# Patient Record
Sex: Female | Born: 1965 | Race: White | Hispanic: No | Marital: Married | Smoking: Former smoker
Health system: Southern US, Community
[De-identification: ages and names within clinical notes are randomized; demographics above are authoritative.]

## PROBLEM LIST (undated history)

## (undated) DIAGNOSIS — M1990 Unspecified osteoarthritis, unspecified site: Secondary | ICD-10-CM

## (undated) DIAGNOSIS — K635 Polyp of colon: Secondary | ICD-10-CM

## (undated) DIAGNOSIS — E782 Mixed hyperlipidemia: Secondary | ICD-10-CM

## (undated) DIAGNOSIS — E785 Hyperlipidemia, unspecified: Secondary | ICD-10-CM

## (undated) DIAGNOSIS — I10 Essential (primary) hypertension: Secondary | ICD-10-CM

## (undated) DIAGNOSIS — R7303 Prediabetes: Secondary | ICD-10-CM

## (undated) DIAGNOSIS — T8859XA Other complications of anesthesia, initial encounter: Secondary | ICD-10-CM

## (undated) DIAGNOSIS — E559 Vitamin D deficiency, unspecified: Secondary | ICD-10-CM

## (undated) DIAGNOSIS — K8010 Calculus of gallbladder with chronic cholecystitis without obstruction: Secondary | ICD-10-CM

## (undated) DIAGNOSIS — J302 Other seasonal allergic rhinitis: Secondary | ICD-10-CM

## (undated) DIAGNOSIS — F419 Anxiety disorder, unspecified: Secondary | ICD-10-CM

## (undated) DIAGNOSIS — T4145XA Adverse effect of unspecified anesthetic, initial encounter: Secondary | ICD-10-CM

## (undated) DIAGNOSIS — E213 Hyperparathyroidism, unspecified: Secondary | ICD-10-CM

## (undated) DIAGNOSIS — G589 Mononeuropathy, unspecified: Secondary | ICD-10-CM

## (undated) HISTORY — DX: Calculus of gallbladder with chronic cholecystitis without obstruction: K80.10

## (undated) HISTORY — PX: BREAST BIOPSY: SHX20

## (undated) HISTORY — PX: DILATION AND CURETTAGE OF UTERUS: SHX78

## (undated) HISTORY — PX: TUMOR EXCISION: SHX421

## (undated) HISTORY — DX: Essential (primary) hypertension: I10

---
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
## 1997-08-26 HISTORY — PX: ABDOMINAL HYSTERECTOMY: SHX81

## 2005-04-23 ENCOUNTER — Ambulatory Visit: Payer: Self-pay | Admitting: Surgery

## 2007-10-15 ENCOUNTER — Ambulatory Visit: Payer: Self-pay | Admitting: Surgery

## 2009-04-10 ENCOUNTER — Ambulatory Visit: Payer: Self-pay | Admitting: Unknown Physician Specialty

## 2009-04-10 HISTORY — PX: COLONOSCOPY: SHX174

## 2010-08-24 ENCOUNTER — Emergency Department: Payer: Self-pay | Admitting: Emergency Medicine

## 2010-08-26 HISTORY — PX: CHOLECYSTECTOMY: SHX55

## 2010-08-27 ENCOUNTER — Emergency Department: Payer: Self-pay | Admitting: Emergency Medicine

## 2011-03-25 ENCOUNTER — Emergency Department: Payer: Self-pay | Admitting: Unknown Physician Specialty

## 2011-05-01 ENCOUNTER — Ambulatory Visit: Payer: Self-pay | Admitting: General Surgery

## 2011-05-01 DIAGNOSIS — T884XXA Failed or difficult intubation, initial encounter: Secondary | ICD-10-CM

## 2011-05-01 HISTORY — DX: Failed or difficult intubation, initial encounter: T88.4XXA

## 2011-05-01 HISTORY — PX: CHOLECYSTECTOMY: SHX55

## 2011-06-10 ENCOUNTER — Ambulatory Visit: Payer: Self-pay | Admitting: General Surgery

## 2011-06-10 HISTORY — PX: BREAST BIOPSY: SHX20

## 2011-08-27 HISTORY — PX: COLONOSCOPY: SHX174

## 2012-06-10 HISTORY — PX: COLONOSCOPY: SHX174

## 2013-07-19 ENCOUNTER — Encounter: Payer: Self-pay | Admitting: General Surgery

## 2013-07-19 ENCOUNTER — Ambulatory Visit (INDEPENDENT_AMBULATORY_CARE_PROVIDER_SITE_OTHER): Payer: BC Managed Care – PPO | Admitting: General Surgery

## 2013-07-19 VITALS — BP 132/70 | HR 76 | Resp 16 | Ht 65.0 in | Wt 261.0 lb

## 2013-07-19 DIAGNOSIS — R928 Other abnormal and inconclusive findings on diagnostic imaging of breast: Secondary | ICD-10-CM

## 2013-07-19 NOTE — Progress Notes (Signed)
Patient ID: Laurie Hubbard, female   DOB: 1965/11/18, 47 y.o.   MRN: [id number]  Chief Complaint  Patient presents with  . Follow-up    mammogram    HPI Laurie Hubbard is a 47 y.o. female who presents for a breast evaluation. The most recent mammogram was done on 06/16/13 at [facility]. Patient does perform regular self breast checks and gets regular mammograms done.   Patient had a stereo biopsy of a density in central right breast in January 2013 with benign findings. She subsequently had a stable mammogram in October 2013 and was discharged at that time. There is question on a recent mammogram at the same location and so she was referred back here.   HPI  Past Medical History  Diagnosis Date  . Hypertension   . Calculus of gallbladder with other cholecystitis, without mention of obstruction     Past Surgical History  Procedure Laterality Date  . Colonoscopy  2013    Dr Mechele Collin  . Tonsillectomy  1971  . Abdominal hysterectomy  1999  . Cholecystectomy  2012  . Breast biopsy Right     History reviewed. No pertinent family history.  Social History History  Substance Use Topics  . Smoking status: Former Smoker -- 1.00 packs/day for 24 years    Types: Cigarettes  . Smokeless tobacco: Never Used  . Alcohol Use: No    Allergies  Allergen Reactions  . Keflet [Cephalexin] Itching    Current Outpatient Prescriptions  Medication Sig Dispense Refill  . cetirizine (ZYRTEC) 10 MG tablet Take 10 mg by mouth daily.      Marland Kitchen estradiol (ESTRACE) 0.5 MG tablet Take 1 tablet by mouth daily.      Marland Kitchen lisinopril-hydrochlorothiazide (PRINZIDE,ZESTORETIC) 20-25 MG per tablet Take 1 tablet by mouth daily.      Marland Kitchen PARoxetine (PAXIL) 10 MG tablet Take 1 tablet by mouth daily.      . simvastatin (ZOCOR) 10 MG tablet Take 1 tablet by mouth daily.       No current facility-administered medications for this visit.    Review of Systems Review of Systems  Constitutional: Negative.   Respiratory:  Negative.   Cardiovascular: Negative.     Blood pressure 132/70, pulse 76, resp. rate 16, height 5\' 5"  (1.651 m), weight 261 lb (118.389 kg).  Physical Exam Physical Exam  Constitutional: She is oriented to person, place, and time. She appears well-developed and well-nourished.  Eyes: Conjunctivae are normal. No scleral icterus.  Neck: Neck supple. No thyromegaly present.  Cardiovascular: Normal rate, regular rhythm and normal heart sounds.   Pulmonary/Chest: Breath sounds normal. Right breast exhibits no inverted nipple, no mass, no nipple discharge, no skin change and no tenderness. Left breast exhibits no inverted nipple, no mass, no nipple discharge, no skin change and no tenderness. Breasts are symmetrical.  Lymphadenopathy:    She has no cervical adenopathy.    She has no axillary adenopathy.  Neurological: She is alert and oriented to person, place, and time.  Skin: Skin is warm and dry.    Data Reviewed Mammogram reviewed. There is no apparent change on my evaluation in the right breast compared to last year's study.   Assessment    Stable exam will discuss current mammogram with the reading radiologist and decide if there is anything of concern.    Plan    Patient will be notified after this discussion.        Thien Berka G 07/21/2013, 6:10 AM

## 2013-07-19 NOTE — Patient Instructions (Addendum)
Follow is to be announced.

## 2013-07-21 ENCOUNTER — Encounter: Payer: Self-pay | Admitting: General Surgery

## 2013-07-29 ENCOUNTER — Telehealth: Payer: Self-pay | Admitting: *Deleted

## 2013-07-29 NOTE — Telephone Encounter (Signed)
Message left on home and cell numbers for patient to call the office.  Patient has been scheduled for right breast additional views at [facility] for 08-04-13 at 2:30 pm (Please note: this is the latest time offered for this type of mammogram per [facility]).

## 2013-07-29 NOTE — Telephone Encounter (Signed)
Spoke with patient and she is not sure if she can make that time or not. She will call [facility] when she gets home today to possibly reschedule this appointment.

## 2013-08-23 ENCOUNTER — Encounter: Payer: Self-pay | Admitting: General Surgery

## 2013-08-24 ENCOUNTER — Telehealth: Payer: Self-pay | Admitting: *Deleted

## 2013-08-24 NOTE — Telephone Encounter (Signed)
Notified patient as instructed, patient pleased. Discussed follow-up appointments, patient agrees. Tussinex 5 ml Q HS prn cough 50 ml called to pharmacy per Dr Evette Cristal.

## 2013-08-24 NOTE — Telephone Encounter (Signed)
Message copied by Currie Paris on Tue Aug 24, 2013  8:29 AM ------      Message from: Kieth Brightly      Created: Mon Aug 23, 2013  1:57 PM       Additional views of breast-no mass. Can return to routine yrly f/u with mammogram in Oct 2015. Please inform pt. ------

## 2013-08-24 NOTE — Telephone Encounter (Signed)
Written prescription done.

## 2014-06-27 ENCOUNTER — Encounter: Payer: Self-pay | Admitting: General Surgery

## 2015-01-30 ENCOUNTER — Encounter: Payer: Self-pay | Admitting: *Deleted

## 2015-02-09 ENCOUNTER — Ambulatory Visit (INDEPENDENT_AMBULATORY_CARE_PROVIDER_SITE_OTHER): Payer: BC Managed Care – PPO | Admitting: Obstetrics and Gynecology

## 2015-02-09 ENCOUNTER — Encounter: Payer: Self-pay | Admitting: Obstetrics and Gynecology

## 2015-02-09 VITALS — BP 151/95 | HR 102 | Ht 65.0 in | Wt 267.1 lb

## 2015-02-09 DIAGNOSIS — N9089 Other specified noninflammatory disorders of vulva and perineum: Secondary | ICD-10-CM | POA: Diagnosis not present

## 2015-02-10 ENCOUNTER — Ambulatory Visit
Admission: RE | Admit: 2015-02-10 | Discharge: 2015-02-10 | Disposition: A | Discharge status: Home / Self Care | Payer: BC Managed Care – PPO | Source: Ambulatory Visit | Attending: Unknown Physician Specialty | Admitting: Unknown Physician Specialty

## 2015-02-10 ENCOUNTER — Encounter
Admission: RE | Disposition: A | Discharge status: Home / Self Care | Payer: Self-pay | Source: Ambulatory Visit | Attending: Unknown Physician Specialty

## 2015-02-10 ENCOUNTER — Ambulatory Visit: Payer: BC Managed Care – PPO | Admitting: Anesthesiology

## 2015-02-10 DIAGNOSIS — M23222 Derangement of posterior horn of medial meniscus due to old tear or injury, left knee: Secondary | ICD-10-CM | POA: Insufficient documentation

## 2015-02-10 DIAGNOSIS — K219 Gastro-esophageal reflux disease without esophagitis: Secondary | ICD-10-CM | POA: Diagnosis not present

## 2015-02-10 DIAGNOSIS — Z9102 Food additives allergy status: Secondary | ICD-10-CM | POA: Insufficient documentation

## 2015-02-10 DIAGNOSIS — Z90710 Acquired absence of both cervix and uterus: Secondary | ICD-10-CM | POA: Insufficient documentation

## 2015-02-10 DIAGNOSIS — Z6841 Body Mass Index (BMI) 40.0 and over, adult: Secondary | ICD-10-CM | POA: Insufficient documentation

## 2015-02-10 DIAGNOSIS — Z803 Family history of malignant neoplasm of breast: Secondary | ICD-10-CM | POA: Diagnosis not present

## 2015-02-10 DIAGNOSIS — Z833 Family history of diabetes mellitus: Secondary | ICD-10-CM | POA: Insufficient documentation

## 2015-02-10 DIAGNOSIS — Z9049 Acquired absence of other specified parts of digestive tract: Secondary | ICD-10-CM | POA: Insufficient documentation

## 2015-02-10 DIAGNOSIS — E785 Hyperlipidemia, unspecified: Secondary | ICD-10-CM | POA: Diagnosis not present

## 2015-02-10 DIAGNOSIS — I10 Essential (primary) hypertension: Secondary | ICD-10-CM | POA: Diagnosis not present

## 2015-02-10 DIAGNOSIS — Z881 Allergy status to other antibiotic agents status: Secondary | ICD-10-CM | POA: Insufficient documentation

## 2015-02-10 DIAGNOSIS — Z8371 Family history of colonic polyps: Secondary | ICD-10-CM | POA: Diagnosis not present

## 2015-02-10 DIAGNOSIS — E669 Obesity, unspecified: Secondary | ICD-10-CM | POA: Insufficient documentation

## 2015-02-10 DIAGNOSIS — Z79899 Other long term (current) drug therapy: Secondary | ICD-10-CM | POA: Diagnosis not present

## 2015-02-10 DIAGNOSIS — M25562 Pain in left knee: Secondary | ICD-10-CM | POA: Diagnosis present

## 2015-02-10 DIAGNOSIS — Z800 Family history of malignant neoplasm of digestive organs: Secondary | ICD-10-CM | POA: Diagnosis not present

## 2015-02-10 HISTORY — PX: KNEE ARTHROSCOPY: SHX127

## 2015-02-10 HISTORY — DX: Other seasonal allergic rhinitis: J30.2

## 2015-02-10 HISTORY — DX: Other complications of anesthesia, initial encounter: T88.59XA

## 2015-02-10 HISTORY — DX: Mononeuropathy, unspecified: G58.9

## 2015-02-10 HISTORY — DX: Adverse effect of unspecified anesthetic, initial encounter: T41.45XA

## 2015-02-10 HISTORY — DX: Hyperlipidemia, unspecified: E78.5

## 2015-02-10 HISTORY — DX: Unspecified osteoarthritis, unspecified site: M19.90

## 2015-02-10 SURGERY — ARTHROSCOPY, KNEE
Anesthesia: General | Laterality: Left | Wound class: Clean

## 2015-02-10 MED ORDER — PROPOFOL 10 MG/ML IV BOLUS
INTRAVENOUS | Status: DC | PRN
  Administered 2015-02-10: 130 mg via INTRAVENOUS
  Administered 2015-02-10: 50 mg via INTRAVENOUS

## 2015-02-10 MED ORDER — OXYCODONE HCL 5 MG/5ML PO SOLN: 5.0000 mg | Freq: Once | ORAL | Status: AC | PRN

## 2015-02-10 MED ORDER — LACTATED RINGERS IV SOLN
INTRAVENOUS | Status: DC
  Administered 2015-02-10: 07:00:00 via INTRAVENOUS

## 2015-02-10 MED ORDER — GLYCOPYRROLATE 0.2 MG/ML IJ SOLN
INTRAMUSCULAR | Status: DC | PRN
  Administered 2015-02-10: .1 mg via INTRAVENOUS

## 2015-02-10 MED ORDER — LACTATED RINGERS IR SOLN
Status: DC | PRN
  Administered 2015-02-10: 1000 mL

## 2015-02-10 MED ORDER — FENTANYL CITRATE (PF) 100 MCG/2ML IJ SOLN
INTRAMUSCULAR | Status: DC | PRN
  Administered 2015-02-10 (×3): 50 ug via INTRAVENOUS
  Administered 2015-02-10: 25 ug via INTRAVENOUS

## 2015-02-10 MED ORDER — SUCCINYLCHOLINE CHLORIDE 20 MG/ML IJ SOLN
INTRAMUSCULAR | Status: DC | PRN
  Administered 2015-02-10: 100 mg via INTRAVENOUS

## 2015-02-10 MED ORDER — DEXAMETHASONE SODIUM PHOSPHATE 4 MG/ML IJ SOLN
INTRAMUSCULAR | Status: DC | PRN
  Administered 2015-02-10: 4 mg via INTRAVENOUS

## 2015-02-10 MED ORDER — OXYCODONE HCL 5 MG PO TABS
5.0000 mg | ORAL_TABLET | Freq: Once | ORAL | Status: AC | PRN
  Administered 2015-02-10: 5 mg via ORAL

## 2015-02-10 MED ORDER — NORCO 5-325 MG PO TABS: 1.0000 | ORAL_TABLET | Freq: Four times a day (QID) | ORAL | Status: DC | PRN

## 2015-02-10 MED ORDER — ONDANSETRON HCL 4 MG/2ML IJ SOLN
INTRAMUSCULAR | Status: DC | PRN
  Administered 2015-02-10: 4 mg via INTRAVENOUS

## 2015-02-10 MED ORDER — LIDOCAINE HCL (CARDIAC) 20 MG/ML IV SOLN
INTRAVENOUS | Status: DC | PRN
  Administered 2015-02-10: 50 mg via INTRAVENOUS

## 2015-02-10 MED ORDER — BUPIVACAINE HCL (PF) 0.5 % IJ SOLN
INTRAMUSCULAR | Status: DC | PRN
  Administered 2015-02-10: 10 mL

## 2015-02-10 MED ORDER — PROMETHAZINE HCL 25 MG/ML IJ SOLN: 6.2500 mg | INTRAMUSCULAR | Status: DC | PRN

## 2015-02-10 MED ORDER — HYDROMORPHONE HCL 1 MG/ML IJ SOLN: 0.2500 mg | INTRAMUSCULAR | Status: DC | PRN

## 2015-02-10 MED ORDER — MIDAZOLAM HCL 5 MG/5ML IJ SOLN
INTRAMUSCULAR | Status: DC | PRN
  Administered 2015-02-10: 2 mg via INTRAVENOUS

## 2015-02-10 MED ORDER — KETOROLAC TROMETHAMINE 30 MG/ML IJ SOLN: 30.0000 mg | Freq: Once | INTRAMUSCULAR | Status: DC | PRN

## 2015-02-10 SURGICAL SUPPLY — 37 items
ARTHROWAND PARAGON T2 (SURGICAL WAND) ×2
BUR RADIUS 3.5 (BURR) IMPLANT
BUR RADIUS 4.0X18.5 (BURR) ×2 IMPLANT
BUR ROUND 5.5 (BURR) IMPLANT
BURR ROUND 12 FLUTE 4.0MM (BURR) IMPLANT
CUFF TOURN SGL QUICK 24 (TOURNIQUET CUFF)
CUFF TOURN SGL QUICK 30 (MISCELLANEOUS)
CUFF TOURN SGL QUICK 34 (TOURNIQUET CUFF) ×1
CUFF TRNQT CYL 24X4X40X1 (TOURNIQUET CUFF) IMPLANT
CUFF TRNQT CYL 34X4X40X1 (TOURNIQUET CUFF) ×1 IMPLANT
CUFF TRNQT CYL LO 30X4X (MISCELLANEOUS) IMPLANT
CUTTER SLOTTED WHISKER 4.0 (BURR) ×2 IMPLANT
DRAPE LEGGINS SURG 28X43 STRL (DRAPES) ×2 IMPLANT
DURAPREP 26ML APPLICATOR (WOUND CARE) ×2 IMPLANT
GAUZE PETROLATUM 1 X8 (GAUZE/BANDAGES/DRESSINGS) ×2 IMPLANT
GAUZE SPONGE 4X4 12PLY STRL (GAUZE/BANDAGES/DRESSINGS) ×2 IMPLANT
GLOVE BIO SURGEON STRL SZ7.5 (GLOVE) ×2 IMPLANT
GLOVE BIO SURGEON STRL SZ8 (GLOVE) ×2 IMPLANT
GLOVE INDICATOR 8.0 STRL GRN (GLOVE) ×2 IMPLANT
GOWN STRL REIN 2XL XLG LVL4 (GOWN DISPOSABLE) ×2 IMPLANT
GOWN STRL REUS W/TWL 2XL LVL3 (GOWN DISPOSABLE) ×2 IMPLANT
IV LACTATED RINGER IRRG 3000ML (IV SOLUTION) ×2
IV LR IRRIG 3000ML ARTHROMATIC (IV SOLUTION) ×2 IMPLANT
MANIFOLD 4PT FOR NEPTUNE1 (MISCELLANEOUS) ×2 IMPLANT
PACK ARTHROSCOPY KNEE (MISCELLANEOUS) ×2 IMPLANT
SET TUBE SUCT SHAVER OUTFL 24K (TUBING) ×2 IMPLANT
SUT ETHILON 3-0 FS-10 30 BLK (SUTURE) ×2
SUTURE EHLN 3-0 FS-10 30 BLK (SUTURE) ×1 IMPLANT
TAPE MICROFOAM 4IN (TAPE) ×2 IMPLANT
TUBING ARTHRO INFLOW-ONLY STRL (TUBING) ×2 IMPLANT
WAND ARTHRO PARAGON T2 (SURGICAL WAND) ×1 IMPLANT
WAND COVAC 50 IFS (MISCELLANEOUS) IMPLANT
WAND HAND CNTRL MULTIVAC 50 (MISCELLANEOUS) ×2 IMPLANT
WAND HAND CNTRL MULTIVAC 90 (MISCELLANEOUS) IMPLANT
WAND MEGAVAC 90 (MISCELLANEOUS) IMPLANT
WAND ULTRAVAC 90 (MISCELLANEOUS) IMPLANT
WRAP KNEE W/COLD PACKS 25.5X14 (SOFTGOODS) ×2 IMPLANT

## 2015-02-10 NOTE — H&P (Signed)
  H and P reviewed. No changes. Uploaded at later date. 

## 2015-02-10 NOTE — Transfer of Care (Signed)
Immediate Anesthesia Transfer of Care Note  Patient: Laurie Hubbard  Procedure(s) Performed: Procedure(s): ARTHROSCOPY KNEE (Left)  Patient Location: PACU  Anesthesia Type: General  Level of Consciousness: awake, alert  and patient cooperative  Airway and Oxygen Therapy: Patient Spontanous Breathing and Patient connected to supplemental oxygen  Post-op Assessment: Post-op Vital signs reviewed, Patient's Cardiovascular Status Stable, Respiratory Function Stable, Patent Airway and No signs of Nausea or vomiting  Post-op Vital Signs: Reviewed and stable  Complications: No apparent anesthesia complications

## 2015-02-10 NOTE — Anesthesia Postprocedure Evaluation (Signed)
  Anesthesia Post-op Note  Patient: Laurie Hubbard  Procedure(s) Performed: Procedure(s): ARTHROSCOPY KNEE (Left)  Anesthesia type:General  Patient location: PACU  Post pain: Pain level controlled  Post assessment: Post-op Vital signs reviewed, Patient's Cardiovascular Status Stable, Respiratory Function Stable, Patent Airway and No signs of Nausea or vomiting  Post vital signs: Reviewed and stable  Last Vitals:  Filed Vitals:   02/10/15 0935  BP:   Pulse: 108  Temp:   Resp: 20    Level of consciousness: awake, alert  and patient cooperative  Complications: No apparent anesthesia complications

## 2015-02-10 NOTE — Op Note (Signed)
Preoperative diagnosis: Torn medial meniscus left knee with possible chondral changes  Postop diagnosis: Torn medial meniscus left knee with medial compartment and retropatellar chondral changes  Operation: Arthroscopic partial medial meniscectomy left knee with chondral debridement  Surgeon: Vilinda Flake, MD  Anesthesia: Gen.   History: Patient's had a long history of left knee pain.  His plain films revealed mild narrowing of her medial compartment on the left .  She had an MRI which revealed a torn medial meniscus and medial compartment chondral changes.The patient was scheduled for surgery due to persistent discomfort despite conservative treatment.  The patient was taken the operating room where satisfactory general anesthesia was achieved. A tourniquet and leg holder were was applied to the left thigh. The left lower extremity was supported with a well leg holder. The left knee was prepped and draped in usual fashion for an arthroscopic procedure. An inflow cannula was introduced superomedially. The joint was distended with lactated Ringer's. Scope was introduced through an inferolateral puncture wound and a probe through an inferomedial puncture wound. Inspection of the medial compartment revealed  a posterior horn medial meniscus tear along with a grade 3 chondral lesion in the mid weightbearing portion of the medial femoral condyle. I went ahead and performed a partial medial meniscectomy using a combination of basket biters and a motorized resector. The remaining rim was contoured with an angled ArthroCare wand. I then debrided and coblated the medial femoral chondral lesion with an ArthroCare Paragon wand. Inspection of the intercondylar notch revealed normal cruciates. Inspection of the the lateral compartment revealed no chondral or meniscal pathology.   The scope was introduced through the intercondylar notch into into the posterior recess of the medial and lateral compartments. No  additional pathology was noted in the posterior recesses. Trochlear groove was inspected and appeared to be fairly smooth.  Patella surface was moderately fibrillated. I debrided the retropatellar surface with a turbo whisker shaver. I observed patella tracking from the superomedial portal. The the patella seemed to track fairly well.  The instruments were removed from the joint at this time. The puncture wounds were closed with 3-0 nylon in vertical mattress fashion. I injected each puncture wound with several cc of half percent Marcaine without epinephrine. Betadine was applied the wounds followed by sterile dressing. An ice pack was applied to the right knee. The patient was awakened and transferred to her stretcher bed. She was taken to the recovery room in satisfactory condition.  The tourniquet was was not inflated during the course of the procedure. Blood loss was negligible.

## 2015-02-10 NOTE — Discharge Instructions (Signed)
General Anesthesia, Care After Refer to this sheet in the next few weeks. These instructions provide you with information on caring for yourself after your procedure. Your health care provider may also give you more specific instructions. Your treatment has been planned according to current medical practices, but problems sometimes occur. Call your health care provider if you have any problems or questions after your procedure. WHAT TO EXPECT AFTER THE PROCEDURE After the procedure, it is typical to experience:  Sleepiness.  Nausea and vomiting. HOME CARE INSTRUCTIONS  For the first 24 hours after general anesthesia:  Have a responsible person with you.  Do not drive a car. If you are alone, do not take public transportation.  Do not drink alcohol.  Do not take medicine that has not been prescribed by your health care provider.  Do not sign important papers or make important decisions.  You may resume a normal diet and activities as directed by your health care provider.  Change bandages (dressings) as directed.  If you have questions or problems that seem related to general anesthesia, call the hospital and ask for the anesthetist or anesthesiologist on call. SEEK MEDICAL CARE IF:  You have nausea and vomiting that continue the day after anesthesia.  You develop a rash. SEEK IMMEDIATE MEDICAL CARE IF:   You have difficulty breathing.  You have chest pain.  You have any allergic problems. Document Released: 11/18/2000 Document Revised: 08/17/2013 Document Reviewed: 02/25/2013 [facility] Patient Information 2015 [place], [place]. This information is not intended to replace advice given to you by your health care provider. Make sure you discuss any questions you have with your health care provider. [facility]  Kathrene Alu., M.D. [phone]   KNEE ARTHROSCOPY POST OPERATION INSTRUCTIONS:  PLEASE READ THESE INSTRUCTIONS ABOUT  POST OPERATION CARE. THEY WILL ANSWER MOST OF YOUR QUESTIONS.  You have been given a prescription for pain. Please take as directed for pain.  You can walk, keeping the knee slightly stiff-avoid doing too much bending the first day. (if ACL reconstruction is performed, keep brace locked in extension when walking.)  You will use crutches or cane if needed. Can weight bear as tolerated  Plan to take three to four days off from work. You can resume work when you are comfortable. (This can be a week or more, depending on the type of work you do.)  To reduce pain and swelling, place one to two pillows under the knee the first two or three days when sitting or lying. An ice pack may be placed on top of the area over the dressing. Instructions for making homemade icepack are as follow:  Flexible homemade alcohol water ice pack  2 cups water  1 cup rubbing alcohol  food coloring for the blue tint (optional)  2 zip-top bags - gallon-size  Mix the water and alcohol together in one of your zip-top bags and add food coloring. Release as much air as possible and seal the bag. Place in freezer for at least 12 hours.  The small incisions in your knee are closed with nylon stitches. They will be removed in the office.  The bulky dressing may be removed in the third day after surgery. (If ACL surgery-DO NOT REMOVE BANDAGES). Put a waterproof band-aid over each stitch. Do not put any creams or ointments on wounds. You may shower at this time, but change waterproof band-aids after showering. KEEP INCISIONS CLEAN AND DRY UNTIL YOU RETURN TO THE OFFICE.  Sometimes the operative area remains somewhat painful and swollen for several weeks. This is usually nothing to worry about, but call if you have any excessive symptoms, especially fever. It is not unusual to have a low grade fever of 99 degrees for the first few days. If persist after 3-4 days call the office. It is not uncommon for the pain to be a little worse on the  third day after surgery.  Begin doing gentle exercises right away. They will be limited by the amount of pain and swelling you have.  Exercising will reduce the swelling, increase motion, and prevent muscle weakness. Exercises: Straight leg raising and gentle knee bending.  Take 81 milligram aspirin twice a day for 2 weeks after meals or milk. This along with elevation will help reduce the possibility of phlebitis in your operated leg.  Avoid strenuous athletics for a minimum of 4 to 6 weeks after arthroscopic surgery (approximately five months if ACL surgery).  If the surgery included ACL reconstruction the brace that is supplied to the extremity post surgery is to be locked in extension when you are asleep and is to be locked in extension when you are ambulating. It can be unlocked for exercises or sitting.  Keep your post surgery appointment that has been made for you. If you do not remember the date call [phone]. Your follow up appointment should be between 7-10 days.

## 2015-02-10 NOTE — Anesthesia Preprocedure Evaluation (Addendum)
Anesthesia Evaluation  Patient identified by MRN, date of birth, ID band Patient awake    Reviewed: Allergy & Precautions, NPO status , Patient's Chart, lab work & pertinent test results  History of Anesthesia Complications (+) DIFFICULT AIRWAY and history of anesthetic complications  Airway Mallampati: III  TM Distance: >3 FB Neck ROM: Full    Dental no notable dental hx.    Pulmonary neg pulmonary ROS, former smoker,  breath sounds clear to auscultation  Pulmonary exam normal       Cardiovascular hypertension, Normal cardiovascular examRhythm:Regular Rate:Normal     Neuro/Psych negative neurological ROS  negative psych ROS   GI/Hepatic negative GI ROS, Neg liver ROS,   Endo/Other  negative endocrine ROS  Renal/GU negative Renal ROS  negative genitourinary   Musculoskeletal negative musculoskeletal ROS (+) Arthritis -,   Abdominal   Peds negative pediatric ROS (+)  Hematology negative hematology ROS (+)   Anesthesia Other Findings   Reproductive/Obstetrics negative OB ROS                            Anesthesia Physical Anesthesia Plan  ASA: III  Anesthesia Plan: General   Post-op Pain Management:    Induction: Intravenous  Airway Management Planned: LMA and Oral ETT  Additional Equipment:   Intra-op Plan:   Post-operative Plan: Extubation in OR  Informed Consent: I have reviewed the patients History and Physical, chart, labs and discussed the procedure including the risks, benefits and alternatives for the proposed anesthesia with the patient or authorized representative who has indicated his/her understanding and acceptance.   Dental advisory given  Plan Discussed with: CRNA  Anesthesia Plan Comments: (glidescope success last surgery.  Per anesthesia record, easy to ventilate, but required 5 attempts to intubate. Will use glidescop)       Anesthesia Quick  Evaluation

## 2015-02-10 NOTE — Anesthesia Procedure Notes (Signed)
Procedure Name: Intubation Date/Time: 02/10/2015 7:46 AM Performed by: Mayme Genta Pre-anesthesia Checklist: Patient identified, Emergency Drugs available, Suction available, Patient being monitored and Timeout performed Patient Re-evaluated:Patient Re-evaluated prior to inductionOxygen Delivery Method: Circle system utilized Preoxygenation: Pre-oxygenation with 100% oxygen Intubation Type: IV induction Ventilation: Mask ventilation without difficulty Laryngoscope Size: Glidescope Grade View: Grade I Tube type: Oral Tube size: 7.0 mm Number of attempts: 1 Airway Equipment and Method: Stylet Placement Confirmation: ETT inserted through vocal cords under direct vision,  positive ETCO2 and breath sounds checked- equal and bilateral Secured at: 22 cm Tube secured with: Tape Dental Injury: Teeth and Oropharynx as per pre-operative assessment  Difficulty Due To: Difficulty was anticipated Comments: Pt shoulders elevated on blankets. Easy mask ventilation. Tolerated well by pt. Grade I view with glidescope.

## 2015-02-13 ENCOUNTER — Encounter: Payer: Self-pay | Admitting: Unknown Physician Specialty

## 2015-02-23 ENCOUNTER — Telehealth: Payer: Self-pay | Admitting: Obstetrics and Gynecology

## 2015-02-23 NOTE — Telephone Encounter (Signed)
PT IS CALLING TO SEE IF YOU KNOW ANYTHING ABOUT THE BRACA TEST AND HER INSURANCE. PT WOULD LIKE A CALL BACK

## 2015-02-24 ENCOUNTER — Telehealth: Payer: Self-pay | Admitting: Obstetrics and Gynecology

## 2015-02-24 NOTE — Telephone Encounter (Signed)
Pt came in, i gave her the myraid promise, asked her to call and if any problems to contact me and i will call Leda Gauze

## 2015-02-24 NOTE — Telephone Encounter (Signed)
See other note

## 2015-02-24 NOTE — Telephone Encounter (Signed)
Pt called yesterday and I left a message she was wondering about her braca, she got home and had a letter from [employer] that they denied the test  Because prior auth was not done, but she told us weeks ago that [employer] would pay for it if the prior auth was done, and she thought that the blood work was being held on to until the prior Josem Kaufmann was done so she is wondering what is going on, do we need to get [facility] involved in this, and do you know if prior auth. has been done. Pt is coming by to frop off the letter from [facility].

## 2015-03-09 ENCOUNTER — Encounter: Payer: Self-pay | Admitting: Obstetrics and Gynecology

## 2015-04-03 ENCOUNTER — Telehealth: Payer: Self-pay | Admitting: Obstetrics and Gynecology

## 2015-04-03 NOTE — Telephone Encounter (Signed)
Pt called and she has been put on an antibiotic due to having teeth pulled and she stated she has a yeast infection and wanted to know if you could sned a diflucan in to her pharmacy [facility] [address] she is leaving tomorrow 04/04/15  At 4:00 to go [place] the beach, pt is aware that you don't work on Mondays and thatt you have a full clinic on Tuesday.

## 2015-04-04 ENCOUNTER — Other Ambulatory Visit: Payer: Self-pay | Admitting: Obstetrics and Gynecology

## 2015-04-04 MED ORDER — FLUCONAZOLE 150 MG PO TABS: 150.0000 mg | ORAL_TABLET | Freq: Once | ORAL | Status: DC

## 2015-04-04 NOTE — Telephone Encounter (Signed)
Called pt notified medication sent to pharmacy

## 2015-04-04 NOTE — Telephone Encounter (Signed)
Please let her know I just sent in, so to stop by on way out of town.

## 2015-05-03 ENCOUNTER — Encounter: Payer: Self-pay | Admitting: Obstetrics and Gynecology

## 2015-05-04 ENCOUNTER — Encounter: Payer: Self-pay | Admitting: Obstetrics and Gynecology

## 2015-05-04 ENCOUNTER — Ambulatory Visit (INDEPENDENT_AMBULATORY_CARE_PROVIDER_SITE_OTHER): Payer: BC Managed Care – PPO | Admitting: Obstetrics and Gynecology

## 2015-05-04 VITALS — BP 133/81 | HR 88 | Ht 65.0 in | Wt 271.5 lb

## 2015-05-04 DIAGNOSIS — Z01419 Encounter for gynecological examination (general) (routine) without abnormal findings: Secondary | ICD-10-CM | POA: Diagnosis not present

## 2015-05-04 NOTE — Progress Notes (Signed)
  Subjective:    Laurie Hubbard is a 49 y.o. female who presents for an annual exam. The patient has no complaints today. The patient is sexually active. GYN screening history: last pap: was normal and last mammogram: was normal. The patient wears seatbelts: yes. The patient participates in regular exercise: not asked. Has the patient ever been transfused or tattooed?: yes. The patient reports that there is not domestic violence in her life.   Menstrual History: OB History    Gravida Para Term Preterm AB TAB SAB Ectopic Multiple Living   2 2        2       Obstetric Comments   1st Menstrual Cycle:  9 1st Pregnancy: 36      Menarche age: 57  No LMP recorded. Patient has had a hysterectomy.    The following portions of the patient's history were reviewed and updated as appropriate: allergies, current medications, past family history, past medical history, past social history, past surgical history and problem list.  Review of Systems A comprehensive review of systems was negative.    Objective:    BP 133/81 mmHg  Pulse 88  Ht 5\' 5"  (1.651 m)  Wt 271 lb 8 oz (123.152 kg)  BMI 45.18 kg/m2  General Appearance:    Alert, cooperative, no distress, appears stated age  Head:    Normocephalic, without obvious abnormality, atraumatic  Eyes:    PERRL, conjunctiva/corneas clear, EOM's intact, fundi    benign, both eyes  Ears:    Normal TM's and external ear canals, both ears  Nose:   Nares normal, septum midline, mucosa normal, no drainage    or sinus tenderness  Throat:   Lips, mucosa, and tongue normal; teeth and gums normal  Neck:   Supple, symmetrical, trachea midline, no adenopathy;    thyroid:  no enlargement/tenderness/nodules; no carotid   bruit or JVD  Back:     Symmetric, no curvature, ROM normal, no CVA tenderness  Lungs:     Clear to auscultation bilaterally, respirations unlabored  Chest Wall:    No tenderness or deformity   Heart:    Regular rate and rhythm, S1 and S2  normal, no murmur, rub   or gallop  Breast Exam:    No tenderness, masses, or nipple abnormality  Abdomen:     Soft, non-tender, bowel sounds active all four quadrants,    no masses, no organomegaly  Genitalia:    Normal female without lesion, discharge or tenderness  Rectal:    Normal tone, normal prostate, no masses or tenderness;   guaiac negative stool  Extremities:   Extremities normal, atraumatic, no cyanosis or edema  Pulses:   2+ and symmetric all extremities  Skin:   Skin color, texture, turgor normal, no rashes or lesions  Lymph nodes:   Cervical, supraclavicular, and axillary nodes normal  Neurologic:   CNII-XII intact, normal strength, sensation and reflexes    throughout  .    Assessment:    Healthy female exam. obesity, family h/o ovarian cancer   Plan:     All questions answered. Blood tests: Comprehensive metabolic panel, Total cholesterol and CA125 and vitamin d. Breast self exam technique reviewed and patient encouraged to perform self-exam monthly. Discussed healthy lifestyle modifications. Mammogram.

## 2015-05-04 NOTE — Patient Instructions (Signed)
  Place postmenopausal annual exam patient instructions here.  Thank you for enrolling in Person. Please follow the instructions below to securely access your online medical record. MyChart allows you to send messages to your doctor, view your test results, manage appointments, and more.   How Do I Sign Up? 1. In your Internet browser, go to [employer] and enter [email or web address]. 2. Click on the Sign Up Now link in the Sign In box. You will see the New Member Sign Up page. 3. Enter your MyChart Access Code exactly as it appears below. You will not need to use this code after you've completed the sign-up process. If you do not sign up before the expiration date, you must request a new code.  MyChart Access Code: [email or web address] Expires: 07/03/2015  4:25 PM  4. Enter your Social Security Number ([id number]) and Date of Birth (mm/dd/yyyy) as indicated and click Submit. You will be taken to the next sign-up page. 5. Create a MyChart ID. This will be your MyChart login ID and cannot be changed, so think of one that is secure and easy to remember. 6. Create a MyChart password. You can change your password at any time. 7. Enter your Password Reset Question and Answer. This can be used at a later time if you forget your password.  8. Enter your e-mail address. You will receive e-mail notification when new information is available in [place]. 9. Click Sign Up. You can now view your medical record.   Additional Information Remember, MyChart is NOT to be used for urgent needs. For medical emergencies, dial 911.

## 2015-05-05 LAB — CA 125: CA 125: 9.6 U/mL (ref 0.0–38.1)

## 2015-05-05 LAB — LIPID PANEL
CHOL/HDL RATIO: 4.4 ratio (ref 0.0–4.4)
Cholesterol, Total: 167 mg/dL (ref 100–199)
HDL: 38 mg/dL — ABNORMAL LOW (ref >39)
LDL CALC: 101 mg/dL — AB (ref 0–99)
TRIGLYCERIDES: 139 mg/dL (ref 0–149)
VLDL Cholesterol Cal: 28 mg/dL (ref 5–40)

## 2015-05-05 LAB — COMPREHENSIVE METABOLIC PANEL
ALT: 25 IU/L (ref 0–32)
AST: 23 IU/L (ref 0–40)
Albumin/Globulin Ratio: 1.8 (ref 1.1–2.5)
Albumin: 4.6 g/dL (ref 3.5–5.5)
Alkaline Phosphatase: 77 IU/L (ref 39–117)
BUN/Creatinine Ratio: 20 (ref 9–23)
BUN: 13 mg/dL (ref 6–24)
Bilirubin Total: 0.7 mg/dL (ref 0.0–1.2)
CALCIUM: 10.7 mg/dL — AB (ref 8.7–10.2)
CO2: 23 mmol/L (ref 18–29)
Chloride: 97 mmol/L (ref 97–108)
Creatinine, Ser: 0.65 mg/dL (ref 0.57–1.00)
GFR, EST AFRICAN AMERICAN: 119 mL/min/{1.73_m2} (ref >59)
GFR, EST NON AFRICAN AMERICAN: 103 mL/min/{1.73_m2} (ref >59)
GLOBULIN, TOTAL: 2.6 g/dL (ref 1.5–4.5)
Glucose: 84 mg/dL (ref 65–99)
Potassium: 3.8 mmol/L (ref 3.5–5.2)
Sodium: 139 mmol/L (ref 134–144)
TOTAL PROTEIN: 7.2 g/dL (ref 6.0–8.5)

## 2015-05-05 LAB — HEMOGLOBIN A1C
ESTIMATED AVERAGE GLUCOSE: 117 mg/dL
Hgb A1c MFr Bld: 5.7 % — ABNORMAL HIGH (ref 4.8–5.6)

## 2015-05-05 LAB — VITAMIN D 25 HYDROXY (VIT D DEFICIENCY, FRACTURES): Vit D, 25-Hydroxy: 32.6 ng/mL (ref 30.0–100.0)

## 2015-05-08 ENCOUNTER — Telehealth: Payer: Self-pay | Admitting: *Deleted

## 2015-05-08 NOTE — Telephone Encounter (Signed)
-----   Message from [name], [place] sent at 05/05/2015  5:09 PM EDT ----- Please let her know all labs look good and CA 125 is normal, HgA1c a little high but better

## 2015-05-08 NOTE — Telephone Encounter (Signed)
Left detailed message for pt about lab results

## 2015-05-16 ENCOUNTER — Other Ambulatory Visit: Payer: Self-pay | Admitting: Obstetrics and Gynecology

## 2015-06-22 HISTORY — PX: COLONOSCOPY: SHX5424

## 2015-06-26 ENCOUNTER — Telehealth: Payer: Self-pay | Admitting: Obstetrics and Gynecology

## 2015-06-26 NOTE — Telephone Encounter (Signed)
Laurie Hubbard has another uti and would like for her sulphur pills to be refilled. She took some old pills she had and feels some better but only had 4.

## 2015-06-27 ENCOUNTER — Other Ambulatory Visit: Payer: Self-pay | Admitting: *Deleted

## 2015-06-27 MED ORDER — CIPROFLOXACIN HCL 500 MG PO TABS: 500.0000 mg | ORAL_TABLET | Freq: Two times a day (BID) | ORAL | Status: DC

## 2015-06-27 MED ORDER — FLUCONAZOLE 150 MG PO TABS: 150.0000 mg | ORAL_TABLET | Freq: Once | ORAL | Status: DC

## 2015-06-27 NOTE — Telephone Encounter (Signed)
Medication sent in. 

## 2015-08-22 ENCOUNTER — Other Ambulatory Visit: Payer: Self-pay | Admitting: Obstetrics and Gynecology

## 2015-09-12 ENCOUNTER — Telehealth: Payer: Self-pay | Admitting: *Deleted

## 2015-09-12 NOTE — Telephone Encounter (Signed)
-----   Message from Joylene Igo, [place] sent at 09/12/2015  9:01 AM EST ----- Please mail note that I have reviewed her MMG and it looks normal.

## 2015-09-12 NOTE — Telephone Encounter (Signed)
Notified pt of normal mammogram

## 2015-09-19 NOTE — Progress Notes (Signed)
Subjective:     Patient ID: Laurie Hubbard, female   DOB: [birth year], 50 y.o.   MRN: [id number]  HPI Small area of irritation on labia, unsure onset, also desires Breast cancer genetic screening.  Review of Systems See above    Objective:   Physical Exam A&O x4  well groomed female in no distress Pelvic exam: normal external genitalia, vulva, vagina, cervix, uterus and adnexa.    Assessment:     Family history of breast cancer Vulvar irritation     Plan:     Labs obtained reasssured of normal exam today RTC prn  Angelicia Lessner [place], CNM

## 2016-05-08 ENCOUNTER — Encounter: Payer: BC Managed Care – PPO | Admitting: Obstetrics and Gynecology

## 2016-05-09 ENCOUNTER — Ambulatory Visit (INDEPENDENT_AMBULATORY_CARE_PROVIDER_SITE_OTHER): Payer: BC Managed Care – PPO | Admitting: Obstetrics and Gynecology

## 2016-05-09 ENCOUNTER — Encounter: Payer: Self-pay | Admitting: Obstetrics and Gynecology

## 2016-05-09 VITALS — BP 142/84 | HR 99 | Ht 65.0 in | Wt 272.0 lb

## 2016-05-09 DIAGNOSIS — E669 Obesity, unspecified: Secondary | ICD-10-CM | POA: Diagnosis not present

## 2016-05-09 DIAGNOSIS — G479 Sleep disorder, unspecified: Secondary | ICD-10-CM | POA: Insufficient documentation

## 2016-05-09 DIAGNOSIS — Z01419 Encounter for gynecological examination (general) (routine) without abnormal findings: Secondary | ICD-10-CM

## 2016-05-09 NOTE — Patient Instructions (Signed)
Preventive Care for Adults, Female A healthy lifestyle and preventive care can promote health and wellness. Preventive health guidelines for women include the following key practices.  A routine yearly physical is a good way to check with your health care provider about your health and preventive screening. It is a chance to share any concerns and updates on your health and to receive a thorough exam.  Visit your dentist for a routine exam and preventive care every 6 months. Brush your teeth twice a day and floss once a day. Good oral hygiene prevents tooth decay and gum disease.  The frequency of eye exams is based on your age, health, family medical history, use of contact lenses, and other factors. Follow your health care provider's recommendations for frequency of eye exams.  Eat a healthy diet. Foods like vegetables, fruits, whole grains, low-fat dairy products, and lean protein foods contain the nutrients you need without too many calories. Decrease your intake of foods high in solid fats, added sugars, and salt. Eat the right amount of calories for you.Get information about a proper diet from your health care provider, if necessary.  Regular physical exercise is one of the most important things you can do for your health. Most adults should get at least 150 minutes of moderate-intensity exercise (any activity that increases your heart rate and causes you to sweat) each week. In addition, most adults need muscle-strengthening exercises on 2 or more days a week.  Maintain a healthy weight. The body mass index (BMI) is a screening tool to identify possible weight problems. It provides an estimate of body fat based on height and weight. Your health care provider can find your BMI and can help you achieve or maintain a healthy weight.For adults 20 years and older:  A BMI below 18.5 is considered underweight.  A BMI of 18.5 to 24.9 is normal.  A BMI of 25 to 29.9 is considered  overweight.  A BMI of 30 and above is considered obese.  Maintain normal blood lipids and cholesterol levels by exercising and minimizing your intake of saturated fat. Eat a balanced diet with plenty of fruit and vegetables. Blood tests for lipids and cholesterol should begin at age 64 and be repeated every 5 years. If your lipid or cholesterol levels are high, you are over 50, or you are at high risk for heart disease, you may need your cholesterol levels checked more frequently.Ongoing high lipid and cholesterol levels should be treated with medicines if diet and exercise are not working.  If you smoke, find out from your health care provider how to quit. If you do not use tobacco, do not start.  Lung cancer screening is recommended for adults aged 52-80 years who are at high risk for developing lung cancer because of a history of smoking. A yearly low-dose CT scan of the lungs is recommended for people who have at least a 30-pack-year history of smoking and are a current smoker or have quit within the past 15 years. A pack year of smoking is smoking an average of 1 pack of cigarettes a day for 1 year (for example: 1 pack a day for 30 years or 2 packs a day for 15 years). Yearly screening should continue until the smoker has stopped smoking for at least 15 years. Yearly screening should be stopped for people who develop a health problem that would prevent them from having lung cancer treatment.  If you are pregnant, do not drink alcohol. If you are  breastfeeding, be very cautious about drinking alcohol. If you are not pregnant and choose to drink alcohol, do not have more than 1 drink per day. One drink is considered to be 12 ounces (355 mL) of beer, 5 ounces (148 mL) of wine, or 1.5 ounces (44 mL) of liquor.  Avoid use of street drugs. Do not share needles with anyone. Ask for help if you need support or instructions about stopping the use of drugs.  High blood pressure causes heart disease and  increases the risk of stroke. Your blood pressure should be checked at least every 1 to 2 years. Ongoing high blood pressure should be treated with medicines if weight loss and exercise do not work.  If you are 25-78 years old, ask your health care provider if you should take aspirin to prevent strokes.  Diabetes screening is done by taking a blood sample to check your blood glucose level after you have not eaten for a certain period of time (fasting). If you are not overweight and you do not have risk factors for diabetes, you should be screened once every 3 years starting at age 86. If you are overweight or obese and you are 3-87 years of age, you should be screened for diabetes every year as part of your cardiovascular risk assessment.  Breast cancer screening is essential preventive care for women. You should practice "breast self-awareness." This means understanding the normal appearance and feel of your breasts and may include breast self-examination. Any changes detected, no matter how small, should be reported to a health care provider. Women in their 66s and 30s should have a clinical breast exam (CBE) by a health care provider as part of a regular health exam every 1 to 3 years. After age 43, women should have a CBE every year. Starting at age 37, women should consider having a mammogram (breast X-ray test) every year. Women who have a family history of breast cancer should talk to their health care provider about genetic screening. Women at a high risk of breast cancer should talk to their health care providers about having an MRI and a mammogram every year.  Breast cancer gene (BRCA)-related cancer risk assessment is recommended for women who have family members with BRCA-related cancers. BRCA-related cancers include breast, ovarian, tubal, and peritoneal cancers. Having family members with these cancers may be associated with an increased risk for harmful changes (mutations) in the breast  cancer genes BRCA1 and BRCA2. Results of the assessment will determine the need for genetic counseling and BRCA1 and BRCA2 testing.  Your health care provider may recommend that you be screened regularly for cancer of the pelvic organs (ovaries, uterus, and vagina). This screening involves a pelvic examination, including checking for microscopic changes to the surface of your cervix (Pap test). You may be encouraged to have this screening done every 3 years, beginning at age 78.  For women ages 79-65, health care providers may recommend pelvic exams and Pap testing every 3 years, or they may recommend the Pap and pelvic exam, combined with testing for human papilloma virus (HPV), every 5 years. Some types of HPV increase your risk of cervical cancer. Testing for HPV may also be done on women of any age with unclear Pap test results.  Other health care providers may not recommend any screening for nonpregnant women who are considered low risk for pelvic cancer and who do not have symptoms. Ask your health care provider if a screening pelvic exam is right for  you.  If you have had past treatment for cervical cancer or a condition that could lead to cancer, you need Pap tests and screening for cancer for at least 20 years after your treatment. If Pap tests have been discontinued, your risk factors (such as having a new sexual partner) need to be reassessed to determine if screening should resume. Some women have medical problems that increase the chance of getting cervical cancer. In these cases, your health care provider may recommend more frequent screening and Pap tests.  Colorectal cancer can be detected and often prevented. Most routine colorectal cancer screening begins at the age of 50 years and continues through age 75 years. However, your health care provider may recommend screening at an earlier age if you have risk factors for colon cancer. On a yearly basis, your health care provider may provide  home test kits to check for hidden blood in the stool. Use of a small camera at the end of a tube, to directly examine the colon (sigmoidoscopy or colonoscopy), can detect the earliest forms of colorectal cancer. Talk to your health care provider about this at age 50, when routine screening begins. Direct exam of the colon should be repeated every 5-10 years through age 75 years, unless early forms of precancerous polyps or small growths are found.  People who are at an increased risk for hepatitis B should be screened for this virus. You are considered at high risk for hepatitis B if:  You were born in a country where hepatitis B occurs often. Talk with your health care provider about which countries are considered high risk.  Your parents were born in a high-risk country and you have not received a shot to protect against hepatitis B (hepatitis B vaccine).  You have HIV or AIDS.  You use needles to inject street drugs.  You live with, or have sex with, someone who has hepatitis B.  You get hemodialysis treatment.  You take certain medicines for conditions like cancer, organ transplantation, and autoimmune conditions.  Hepatitis C blood testing is recommended for all people born from 1945 through 1965 and any individual with known risks for hepatitis C.  Practice safe sex. Use condoms and avoid high-risk sexual practices to reduce the spread of sexually transmitted infections (STIs). STIs include gonorrhea, chlamydia, syphilis, trichomonas, herpes, HPV, and human immunodeficiency virus (HIV). Herpes, HIV, and HPV are viral illnesses that have no cure. They can result in disability, cancer, and death.  You should be screened for sexually transmitted illnesses (STIs) including gonorrhea and chlamydia if:  You are sexually active and are younger than 24 years.  You are older than 24 years and your health care provider tells you that you are at risk for this type of infection.  Your sexual  activity has changed since you were last screened and you are at an increased risk for chlamydia or gonorrhea. Ask your health care provider if you are at risk.  If you are at risk of being infected with HIV, it is recommended that you take a prescription medicine daily to prevent HIV infection. This is called preexposure prophylaxis (PrEP). You are considered at risk if:  You are sexually active and do not regularly use condoms or know the HIV status of your partner(s).  You take drugs by injection.  You are sexually active with a partner who has HIV.  Talk with your health care provider about whether you are at high risk of being infected with HIV. If   you choose to begin PrEP, you should first be tested for HIV. You should then be tested every 3 months for as long as you are taking PrEP.  Osteoporosis is a disease in which the bones lose minerals and strength with aging. This can result in serious bone fractures or breaks. The risk of osteoporosis can be identified using a bone density scan. Women ages 1 years and over and women at risk for fractures or osteoporosis should discuss screening with their health care providers. Ask your health care provider whether you should take a calcium supplement or vitamin D to reduce the rate of osteoporosis.  Menopause can be associated with physical symptoms and risks. Hormone replacement therapy is available to decrease symptoms and risks. You should talk to your health care provider about whether hormone replacement therapy is right for you.  Use sunscreen. Apply sunscreen liberally and repeatedly throughout the day. You should seek shade when your shadow is shorter than you. Protect yourself by wearing long sleeves, pants, a wide-brimmed hat, and sunglasses year round, whenever you are outdoors.  Once a month, do a whole body skin exam, using a mirror to look at the skin on your back. Tell your health care provider of new moles, moles that have irregular  borders, moles that are larger than a pencil eraser, or moles that have changed in shape or color.  Stay current with required vaccines (immunizations).  Influenza vaccine. All adults should be immunized every year.  Tetanus, diphtheria, and acellular pertussis (Td, Tdap) vaccine. Pregnant women should receive 1 dose of Tdap vaccine during each pregnancy. The dose should be obtained regardless of the length of time since the last dose. Immunization is preferred during the 27th-36th week of gestation. An adult who has not previously received Tdap or who does not know her vaccine status should receive 1 dose of Tdap. This initial dose should be followed by tetanus and diphtheria toxoids (Td) booster doses every 10 years. Adults with an unknown or incomplete history of completing a 3-dose immunization series with Td-containing vaccines should begin or complete a primary immunization series including a Tdap dose. Adults should receive a Td booster every 10 years.  Varicella vaccine. An adult without evidence of immunity to varicella should receive 2 doses or a second dose if she has previously received 1 dose. Pregnant females who do not have evidence of immunity should receive the first dose after pregnancy. This first dose should be obtained before leaving the health care facility. The second dose should be obtained 4-8 weeks after the first dose.  Human papillomavirus (HPV) vaccine. Females aged 13-26 years who have not received the vaccine previously should obtain the 3-dose series. The vaccine is not recommended for use in pregnant females. However, pregnancy testing is not needed before receiving a dose. If a female is found to be pregnant after receiving a dose, no treatment is needed. In that case, the remaining doses should be delayed until after the pregnancy. Immunization is recommended for any person with an immunocompromised condition through the age of 24 years if she did not get any or all doses  earlier. During the 3-dose series, the second dose should be obtained 4-8 weeks after the first dose. The third dose should be obtained 24 weeks after the first dose and 16 weeks after the second dose.  Zoster vaccine. One dose is recommended for adults aged 97 years or older unless certain conditions are present.  Measles, mumps, and rubella (MMR) vaccine. Adults born  before 1957 generally are considered immune to measles and mumps. Adults born in [birth year] or later should have 1 or more doses of MMR vaccine unless there is a contraindication to the vaccine or there is laboratory evidence of immunity to each of the three diseases. A routine second dose of MMR vaccine should be obtained at least 28 days after the first dose for students attending postsecondary schools, health care workers, or international travelers. People who received inactivated measles vaccine or an unknown type of measles vaccine during 1963-1967 should receive 2 doses of MMR vaccine. People who received inactivated mumps vaccine or an unknown type of mumps vaccine before 1979 and are at high risk for mumps infection should consider immunization with 2 doses of MMR vaccine. For females of childbearing age, rubella immunity should be determined. If there is no evidence of immunity, females who are not pregnant should be vaccinated. If there is no evidence of immunity, females who are pregnant should delay immunization until after pregnancy. Unvaccinated health care workers born before 60 who lack laboratory evidence of measles, mumps, or rubella immunity or laboratory confirmation of disease should consider measles and mumps immunization with 2 doses of MMR vaccine or rubella immunization with 1 dose of MMR vaccine.  Pneumococcal 13-valent conjugate (PCV13) vaccine. When indicated, a person who is uncertain of his immunization history and has no record of immunization should receive the PCV13 vaccine. All adults 61 years of age and older  should receive this vaccine. An adult aged 92 years or older who has certain medical conditions and has not been previously immunized should receive 1 dose of PCV13 vaccine. This PCV13 should be followed with a dose of pneumococcal polysaccharide (PPSV23) vaccine. Adults who are at high risk for pneumococcal disease should obtain the PPSV23 vaccine at least 8 weeks after the dose of PCV13 vaccine. Adults older than 52 years of age who have normal immune system function should obtain the PPSV23 vaccine dose at least 1 year after the dose of PCV13 vaccine.  Pneumococcal polysaccharide (PPSV23) vaccine. When PCV13 is also indicated, PCV13 should be obtained first. All adults aged 2 years and older should be immunized. An adult younger than age 30 years who has certain medical conditions should be immunized. Any person who resides in a nursing home or long-term care facility should be immunized. An adult smoker should be immunized. People with an immunocompromised condition and certain other conditions should receive both PCV13 and PPSV23 vaccines. People with human immunodeficiency virus (HIV) infection should be immunized as soon as possible after diagnosis. Immunization during chemotherapy or radiation therapy should be avoided. Routine use of PPSV23 vaccine is not recommended for American Indians, [place] Natives, or people younger than 65 years unless there are medical conditions that require PPSV23 vaccine. When indicated, people who have unknown immunization and have no record of immunization should receive PPSV23 vaccine. One-time revaccination 5 years after the first dose of PPSV23 is recommended for people aged 19-64 years who have chronic kidney failure, nephrotic syndrome, asplenia, or immunocompromised conditions. People who received 1-2 doses of PPSV23 before age 44 years should receive another dose of PPSV23 vaccine at age 83 years or later if at least 5 years have passed since the previous dose. Doses  of PPSV23 are not needed for people immunized with PPSV23 at or after age 20 years.  Meningococcal vaccine. Adults with asplenia or persistent complement component deficiencies should receive 2 doses of quadrivalent meningococcal conjugate (MenACWY-D) vaccine. The doses should be obtained  at least 2 months apart. Microbiologists working with certain meningococcal bacteria, [place] recruits, people at risk during an outbreak, and people who travel to or live in countries with a high rate of meningitis should be immunized. A first-year college student up through age 28 years who is living in a residence hall should receive a dose if she did not receive a dose on or after her 16th birthday. Adults who have certain high-risk conditions should receive one or more doses of vaccine.  Hepatitis A vaccine. Adults who wish to be protected from this disease, have certain high-risk conditions, work with hepatitis A-infected animals, work in hepatitis A research labs, or travel to or work in countries with a high rate of hepatitis A should be immunized. Adults who were previously unvaccinated and who anticipate close contact with an international adoptee during the first 60 days after arrival in the [place] from a country with a high rate of hepatitis A should be immunized.  Hepatitis B vaccine. Adults who wish to be protected from this disease, have certain high-risk conditions, may be exposed to blood or other infectious body fluids, are household contacts or sex partners of hepatitis B positive people, are clients or workers in certain care facilities, or travel to or work in countries with a high rate of hepatitis B should be immunized.  Haemophilus influenzae type b (Hib) vaccine. A previously unvaccinated person with asplenia or sickle cell disease or having a scheduled splenectomy should receive 1 dose of Hib vaccine. Regardless of previous immunization, a recipient of a hematopoietic stem cell transplant  should receive a 3-dose series 6-12 months after her successful transplant. Hib vaccine is not recommended for adults with HIV infection. Preventive Services / Frequency Ages 71 to 87 years  Blood pressure check.** / Every 3-5 years.  Lipid and cholesterol check.** / Every 5 years beginning at age 1.  Clinical breast exam.** / Every 3 years for women in their 3s and 31s.  BRCA-related cancer risk assessment.** / For women who have family members with a BRCA-related cancer (breast, ovarian, tubal, or peritoneal cancers).  Pap test.** / Every 2 years from ages 50 through 86. Every 3 years starting at age 87 through age 7 or 75 with a history of 3 consecutive normal Pap tests.  HPV screening.** / Every 3 years from ages 59 through ages 35 to 6 with a history of 3 consecutive normal Pap tests.  Hepatitis C blood test.** / For any individual with known risks for hepatitis C.  Skin self-exam. / Monthly.  Influenza vaccine. / Every year.  Tetanus, diphtheria, and acellular pertussis (Tdap, Td) vaccine.** / Consult your health care provider. Pregnant women should receive 1 dose of Tdap vaccine during each pregnancy. 1 dose of Td every 10 years.  Varicella vaccine.** / Consult your health care provider. Pregnant females who do not have evidence of immunity should receive the first dose after pregnancy.  HPV vaccine. / 3 doses over 6 months, if 72 and younger. The vaccine is not recommended for use in pregnant females. However, pregnancy testing is not needed before receiving a dose.  Measles, mumps, rubella (MMR) vaccine.** / You need at least 1 dose of MMR if you were born in 1957 or later. You may also need a 2nd dose. For females of childbearing age, rubella immunity should be determined. If there is no evidence of immunity, females who are not pregnant should be vaccinated. If there is no evidence of immunity, females who are  pregnant should delay immunization until after  pregnancy.  Pneumococcal 13-valent conjugate (PCV13) vaccine.** / Consult your health care provider.  Pneumococcal polysaccharide (PPSV23) vaccine.** / 1 to 2 doses if you smoke cigarettes or if you have certain conditions.  Meningococcal vaccine.** / 1 dose if you are age 87 to 44 years and a Market researcher living in a residence hall, or have one of several medical conditions, you need to get vaccinated against meningococcal disease. You may also need additional booster doses.  Hepatitis A vaccine.** / Consult your health care provider.  Hepatitis B vaccine.** / Consult your health care provider.  Haemophilus influenzae type b (Hib) vaccine.** / Consult your health care provider. Ages 86 to 38 years  Blood pressure check.** / Every year.  Lipid and cholesterol check.** / Every 5 years beginning at age 49 years.  Lung cancer screening. / Every year if you are aged 71-80 years and have a 30-pack-year history of smoking and currently smoke or have quit within the past 15 years. Yearly screening is stopped once you have quit smoking for at least 15 years or develop a health problem that would prevent you from having lung cancer treatment.  Clinical breast exam.** / Every year after age 51 years.  BRCA-related cancer risk assessment.** / For women who have family members with a BRCA-related cancer (breast, ovarian, tubal, or peritoneal cancers).  Mammogram.** / Every year beginning at age 18 years and continuing for as long as you are in good health. Consult with your health care provider.  Pap test.** / Every 3 years starting at age 63 years through age 37 or 57 years with a history of 3 consecutive normal Pap tests.  HPV screening.** / Every 3 years from ages 41 years through ages 76 to 23 years with a history of 3 consecutive normal Pap tests.  Fecal occult blood test (FOBT) of stool. / Every year beginning at age 36 years and continuing until age 51 years. You may not need  to do this test if you get a colonoscopy every 10 years.  Flexible sigmoidoscopy or colonoscopy.** / Every 5 years for a flexible sigmoidoscopy or every 10 years for a colonoscopy beginning at age 36 years and continuing until age 35 years.  Hepatitis C blood test.** / For all people born from [birth year] through 1965 and any individual with known risks for hepatitis C.  Skin self-exam. / Monthly.  Influenza vaccine. / Every year.  Tetanus, diphtheria, and acellular pertussis (Tdap/Td) vaccine.** / Consult your health care provider. Pregnant women should receive 1 dose of Tdap vaccine during each pregnancy. 1 dose of Td every 10 years.  Varicella vaccine.** / Consult your health care provider. Pregnant females who do not have evidence of immunity should receive the first dose after pregnancy.  Zoster vaccine.** / 1 dose for adults aged 73 years or older.  Measles, mumps, rubella (MMR) vaccine.** / You need at least 1 dose of MMR if you were born in 1957 or later. You may also need a second dose. For females of childbearing age, rubella immunity should be determined. If there is no evidence of immunity, females who are not pregnant should be vaccinated. If there is no evidence of immunity, females who are pregnant should delay immunization until after pregnancy.  Pneumococcal 13-valent conjugate (PCV13) vaccine.** / Consult your health care provider.  Pneumococcal polysaccharide (PPSV23) vaccine.** / 1 to 2 doses if you smoke cigarettes or if you have certain conditions.  Meningococcal vaccine.** /  Consult your health care provider.  Hepatitis A vaccine.** / Consult your health care provider.  Hepatitis B vaccine.** / Consult your health care provider.  Haemophilus influenzae type b (Hib) vaccine.** / Consult your health care provider. Ages 80 years and over  Blood pressure check.** / Every year.  Lipid and cholesterol check.** / Every 5 years beginning at age 62 years.  Lung cancer  screening. / Every year if you are aged 32-80 years and have a 30-pack-year history of smoking and currently smoke or have quit within the past 15 years. Yearly screening is stopped once you have quit smoking for at least 15 years or develop a health problem that would prevent you from having lung cancer treatment.  Clinical breast exam.** / Every year after age 61 years.  BRCA-related cancer risk assessment.** / For women who have family members with a BRCA-related cancer (breast, ovarian, tubal, or peritoneal cancers).  Mammogram.** / Every year beginning at age 39 years and continuing for as long as you are in good health. Consult with your health care provider.  Pap test.** / Every 3 years starting at age 85 years through age 74 or 72 years with 3 consecutive normal Pap tests. Testing can be stopped between 65 and 70 years with 3 consecutive normal Pap tests and no abnormal Pap or HPV tests in the past 10 years.  HPV screening.** / Every 3 years from ages 55 years through ages 67 or 77 years with a history of 3 consecutive normal Pap tests. Testing can be stopped between 65 and 70 years with 3 consecutive normal Pap tests and no abnormal Pap or HPV tests in the past 10 years.  Fecal occult blood test (FOBT) of stool. / Every year beginning at age 81 years and continuing until age 22 years. You may not need to do this test if you get a colonoscopy every 10 years.  Flexible sigmoidoscopy or colonoscopy.** / Every 5 years for a flexible sigmoidoscopy or every 10 years for a colonoscopy beginning at age 67 years and continuing until age 22 years.  Hepatitis C blood test.** / For all people born from [birth year] through 1967 and any individual with known risks for hepatitis C.  Osteoporosis screening.** / A one-time screening for women ages 8 years and over and women at risk for fractures or osteoporosis.  Skin self-exam. / Monthly.  Influenza vaccine. / Every year.  Tetanus, diphtheria, and  acellular pertussis (Tdap/Td) vaccine.** / 1 dose of Td every 10 years.  Varicella vaccine.** / Consult your health care provider.  Zoster vaccine.** / 1 dose for adults aged 56 years or older.  Pneumococcal 13-valent conjugate (PCV13) vaccine.** / Consult your health care provider.  Pneumococcal polysaccharide (PPSV23) vaccine.** / 1 dose for all adults aged 15 years and older.  Meningococcal vaccine.** / Consult your health care provider.  Hepatitis A vaccine.** / Consult your health care provider.  Hepatitis B vaccine.** / Consult your health care provider.  Haemophilus influenzae type b (Hib) vaccine.** / Consult your health care provider. ** Family history and personal history of risk and conditions may change your health care provider's recommendations.   This information is not intended to replace advice given to you by your health care provider. Make sure you discuss any questions you have with your health care provider.   Document Released: 10/08/2001 Document Revised: 09/02/2014 Document Reviewed: 01/07/2011 Elsevier Interactive Patient Education [employer].

## 2016-05-09 NOTE — Progress Notes (Signed)
Subjective:   Laurie Hubbard is a 50 y.o. G37P2 Caucasian female here for a routine well-woman exam.  No LMP recorded. Patient has had a hysterectomy.    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  Past Medical History Past Medical History:  Diagnosis Date  . Arthritis    knees and left thumb  . Calculus of gallbladder with other cholecystitis, without mention of obstruction   . Complication of anesthesia    Diff intubation - 05/01/11 - [facility] -  see report in paper chart  . Hyperlipidemia   . Hypertension   . Pinched nerve    lower back  . Seasonal allergies     Past Surgical History Past Surgical History:  Procedure Laterality Date  . ABDOMINAL HYSTERECTOMY  1999  . BREAST BIOPSY Right   . CHOLECYSTECTOMY  2012   diff intubation - see records  . COLONOSCOPY  2013   Dr Vira Agar  . DILATION AND CURETTAGE OF UTERUS    . KNEE ARTHROSCOPY Left 02/10/2015   Procedure: ARTHROSCOPY KNEE;  Surgeon: Leanor Kail, MD;  Location: [place];  Service: Orthopedics;  Laterality: Left;  . TONSILLECTOMY  1971  . TUMOR EXCISION Right    Hip - benign    Gynecologic History G2P2  No LMP recorded. Patient has had a hysterectomy. Contraception: post menopausal status Last Pap: 2011. Results were: normal Last mammogram: 2016. Results were: normal  Obstetric History OB History  Gravida Para Term Preterm AB Living  2 2       2   SAB TAB Ectopic Multiple Live Births               # Outcome  Date GA Lbr Len/2nd Weight Sex Delivery Anes PTL Lv  2 Para           1 Para             Obstetric Comments  1st Menstrual Cycle:  9  1st Pregnancy: 17    Current Medications Current Outpatient Prescriptions on File Prior to Visit  Medication Sig Dispense Refill  . cetirizine (ZYRTEC) 10 MG tablet Take 10 mg by mouth daily. AM    . Cholecalciferol (VITAMIN D PO) Take by mouth.    . estradiol (ESTRACE) 0.5 MG tablet take 1 tablet by mouth once daily 90 tablet 4  . lisinopril-hydrochlorothiazide (PRINZIDE,ZESTORETIC) 20-25 MG per tablet Take 1 tablet by mouth daily. AM    . Multiple Vitamin (MULTIVITAMIN) capsule Take 1 capsule by mouth daily.    Marland Kitchen PARoxetine (PAXIL) 10 MG tablet Take 1 tablet by mouth daily. AM    . simvastatin (ZOCOR) 10 MG tablet Take 1 tablet by mouth daily. PM    . ciprofloxacin (CIPRO) 500 MG tablet take 1 tablet by mouth twice a day 10 tablet 0  . fluconazole (DIFLUCAN) 150 MG tablet Take 1 tablet (150 mg total) by mouth once. Can take additional dose three days later if symptoms persist (Patient not taking: Reported on 05/09/2016) 1 tablet 3   No current facility-administered medications on file prior to visit.  Review of Systems Patient denies any headaches, blurred vision, shortness of breath, chest pain, abdominal pain, problems with bowel movements, urination, or intercourse.  Objective:  BP (!) 142/84   Pulse 99   Ht 5\' 5"  (1.651 m)   Wt 272 lb (123.4 kg)   BMI 45.26 kg/m  Physical Exam  General:  Well developed, well nourished, no acute distress. She is alert and oriented x3. Skin:  Warm and dry Neck:  Midline trachea, no thyromegaly or nodules Cardiovascular: Regular rate and rhythm, no murmur heard Lungs:  Effort normal, all lung fields clear to auscultation bilaterally Breasts:  No dominant palpable mass, retraction, or nipple discharge Abdomen:  Soft, non tender, no hepatosplenomegaly or masses Pelvic:  External genitalia is normal in  appearance.  The vagina is normal in appearance. The cervix is surgically absent Thin prep pap is done with HR HPV cotesting. Uterus is surgically absent. No adnexal masses or tenderness noted. Extremities:  No swelling or varicosities noted Psych:  She has a normal mood and affect  Assessment:   Healthy well-woman exam Obesity Sleep disturbance   Plan:  Will go to outpatient center for labs. F/U 1 year for AE, or sooner if needed Mammogram ordered at [facility] imaging or sooner if problems Colonoscopy done last year, due in 2 years  Melody Rockney Ghee, [place]

## 2016-05-10 ENCOUNTER — Other Ambulatory Visit: Payer: Self-pay | Admitting: Obstetrics and Gynecology

## 2016-05-11 LAB — CA 125: CA 125: 7.3 U/mL (ref 0.0–38.1)

## 2016-05-13 LAB — CYTOLOGY - PAP

## 2016-05-14 ENCOUNTER — Encounter: Payer: Self-pay | Admitting: Obstetrics and Gynecology

## 2016-05-15 ENCOUNTER — Other Ambulatory Visit: Payer: Self-pay | Admitting: *Deleted

## 2016-05-15 ENCOUNTER — Telehealth: Payer: Self-pay | Admitting: Obstetrics and Gynecology

## 2016-05-15 MED ORDER — ESTRADIOL 0.5 MG PO TABS: 0.5000 mg | ORAL_TABLET | Freq: Every day | ORAL | 4 refills | Status: DC

## 2016-05-15 NOTE — Telephone Encounter (Signed)
She was calling about CA-125 results

## 2016-05-15 NOTE — Telephone Encounter (Signed)
Notified pt. 

## 2016-09-04 ENCOUNTER — Encounter: Payer: Self-pay | Admitting: Obstetrics and Gynecology

## 2016-09-17 ENCOUNTER — Telehealth: Payer: Self-pay | Admitting: Obstetrics and Gynecology

## 2016-09-17 NOTE — Telephone Encounter (Signed)
This pt went to [facility] and they saw something in rt breast and need a diagnostic. She wants to go to [facility] in [place] bc ins will pay 100% there. But she needs a ref there. She would like it Monday because she is off.

## 2016-09-18 ENCOUNTER — Other Ambulatory Visit: Payer: Self-pay | Admitting: Obstetrics and Gynecology

## 2016-09-18 DIAGNOSIS — N6489 Other specified disorders of breast: Secondary | ICD-10-CM

## 2016-09-23 ENCOUNTER — Ambulatory Visit
Admission: RE | Admit: 2016-09-23 | Discharge: 2016-09-23 | Disposition: A | Discharge status: Home / Self Care | Payer: BC Managed Care – PPO | Source: Ambulatory Visit | Attending: Obstetrics and Gynecology | Admitting: Obstetrics and Gynecology

## 2016-09-23 ENCOUNTER — Other Ambulatory Visit: Payer: Self-pay | Admitting: Obstetrics and Gynecology

## 2016-09-23 DIAGNOSIS — N6489 Other specified disorders of breast: Secondary | ICD-10-CM

## 2016-10-04 ENCOUNTER — Other Ambulatory Visit: Payer: Self-pay | Admitting: Obstetrics and Gynecology

## 2016-10-04 ENCOUNTER — Ambulatory Visit
Admission: RE | Admit: 2016-10-04 | Discharge: 2016-10-04 | Disposition: A | Discharge status: Home / Self Care | Payer: BC Managed Care – PPO | Source: Ambulatory Visit | Attending: Obstetrics and Gynecology | Admitting: Obstetrics and Gynecology

## 2016-10-04 DIAGNOSIS — N6489 Other specified disorders of breast: Secondary | ICD-10-CM

## 2016-10-04 HISTORY — PX: BREAST BIOPSY: SHX20

## 2017-03-24 ENCOUNTER — Other Ambulatory Visit: Payer: Self-pay | Admitting: Obstetrics and Gynecology

## 2017-03-24 DIAGNOSIS — N6489 Other specified disorders of breast: Secondary | ICD-10-CM

## 2017-04-08 ENCOUNTER — Ambulatory Visit
Admission: RE | Admit: 2017-04-08 | Discharge: 2017-04-08 | Disposition: A | Discharge status: Home / Self Care | Payer: BC Managed Care – PPO | Source: Ambulatory Visit | Attending: Obstetrics and Gynecology | Admitting: Obstetrics and Gynecology

## 2017-04-08 ENCOUNTER — Other Ambulatory Visit: Payer: Self-pay | Admitting: Obstetrics and Gynecology

## 2017-04-08 ENCOUNTER — Other Ambulatory Visit: Payer: BC Managed Care – PPO

## 2017-04-08 DIAGNOSIS — N6489 Other specified disorders of breast: Secondary | ICD-10-CM

## 2017-05-29 ENCOUNTER — Encounter: Payer: BC Managed Care – PPO | Admitting: Obstetrics and Gynecology

## 2017-05-30 ENCOUNTER — Telehealth: Payer: Self-pay | Admitting: Obstetrics and Gynecology

## 2017-05-30 ENCOUNTER — Other Ambulatory Visit: Payer: Self-pay | Admitting: *Deleted

## 2017-05-30 ENCOUNTER — Encounter: Payer: Self-pay | Admitting: Obstetrics and Gynecology

## 2017-05-30 ENCOUNTER — Ambulatory Visit (INDEPENDENT_AMBULATORY_CARE_PROVIDER_SITE_OTHER): Payer: BC Managed Care – PPO | Admitting: Obstetrics and Gynecology

## 2017-05-30 VITALS — BP 135/81 | HR 79 | Ht 65.0 in | Wt 276.9 lb

## 2017-05-30 DIAGNOSIS — Z803 Family history of malignant neoplasm of breast: Secondary | ICD-10-CM

## 2017-05-30 DIAGNOSIS — Z800 Family history of malignant neoplasm of digestive organs: Secondary | ICD-10-CM | POA: Diagnosis not present

## 2017-05-30 DIAGNOSIS — Z01419 Encounter for gynecological examination (general) (routine) without abnormal findings: Secondary | ICD-10-CM | POA: Diagnosis not present

## 2017-05-30 MED ORDER — FLUCONAZOLE 150 MG PO TABS: 150.0000 mg | ORAL_TABLET | Freq: Once | ORAL | 1 refills | Status: AC

## 2017-05-30 MED ORDER — TERCONAZOLE 0.4 % VA CREA: 1.0000 | TOPICAL_CREAM | Freq: Every day | VAGINAL | 0 refills | Status: DC

## 2017-05-30 NOTE — Progress Notes (Signed)
Subjective:   Laurie Hubbard is a 51 y.o. G61P2 Caucasian female here for a routine well-woman exam.  No LMP recorded. Patient has had a hysterectomy.    Current complaints: slight vaginal itching since taking steroids for back injury. 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  Past Medical History Past Medical History:  Diagnosis Date  . Arthritis    knees and left thumb  . Calculus of gallbladder with other cholecystitis, without mention of obstruction   . Complication of anesthesia    Diff intubation - 05/01/11 - [facility] -  see report in paper chart  . Hyperlipidemia   . Hypertension   . Pinched nerve    lower back  . Seasonal allergies     Past Surgical History Past Surgical History:  Procedure Laterality Date  . ABDOMINAL HYSTERECTOMY  1999  . BREAST BIOPSY Right   . CHOLECYSTECTOMY  2012   diff intubation - see records  . COLONOSCOPY  2013   Dr Vira Agar  . DILATION AND CURETTAGE OF UTERUS    . KNEE ARTHROSCOPY Left 02/10/2015   Procedure: ARTHROSCOPY KNEE;  Surgeon: Leanor Kail, MD;  Location: [place];  Service: Orthopedics;  Laterality: Left;  . TONSILLECTOMY  1971  . TUMOR EXCISION Right    Hip - benign    Gynecologic History G2P2  No LMP recorded. Patient has had a hysterectomy. Contraception: status post hysterectomy Last Pap: 2017. Results were: normal Last mammogram: 2018. Results were: abnormal   Obstetric History OB History  Gravida Para Term Preterm AB Living  2 2       2   SAB TAB Ectopic Multiple Live  Births               # Outcome Date GA Lbr Len/2nd Weight Sex Delivery Anes PTL Lv  2 Para           1 Para             Obstetric Comments  1st Menstrual Cycle:  9  1st Pregnancy: 17    Current Medications Current Outpatient Prescriptions on File Prior to Visit  Medication Sig Dispense Refill  . cetirizine (ZYRTEC) 10 MG tablet Take 10 mg by mouth daily. AM    . Cholecalciferol (VITAMIN D PO) Take by mouth.    . estradiol (ESTRACE) 0.5 MG tablet Take 1 tablet (0.5 mg total) by mouth daily. 90 tablet 4  . lisinopril-hydrochlorothiazide (PRINZIDE,ZESTORETIC) 20-25 MG per tablet Take 1 tablet by mouth daily. AM    . PARoxetine (PAXIL) 10 MG tablet Take 1 tablet by mouth daily. AM    . simvastatin (ZOCOR) 10 MG tablet Take 1 tablet by mouth daily. PM    . ciprofloxacin (CIPRO) 500 MG tablet take 1 tablet by mouth twice a day (Patient not taking: Reported on 05/30/2017) 10 tablet 0  . fluconazole (DIFLUCAN) 150 MG tablet Take 1 tablet (150 mg total) by mouth once. Can take additional dose three days later if symptoms persist (Patient not taking: Reported on 05/09/2016) 1 tablet 3  . Multiple Vitamin (MULTIVITAMIN) capsule Take 1 capsule by mouth daily.  No current facility-administered medications on file prior to visit.     Review of Systems Patient denies any headaches, blurred vision, shortness of breath, chest pain, abdominal pain, problems with bowel movements, urination, or intercourse.  Objective:  BP 135/81   Pulse 79   Ht 5\' 5"  (1.651 m)   Wt 276 lb 14.4 oz (125.6 kg)   BMI 46.08 kg/m  Physical Exam  General:  Well developed, well nourished, no acute distress. She is alert and oriented x3. Skin:  Warm and dry Neck:  Midline trachea, no thyromegaly or nodules Cardiovascular: Regular rate and rhythm, no murmur heard Lungs:  Effort normal, all lung fields clear to auscultation bilaterally Breasts:  No dominant palpable mass, retraction, or nipple discharge Abdomen:   Soft, non tender, no hepatosplenomegaly or masses Pelvic:  External genitalia is normal in appearance.  The vagina is normal in appearance. The cervix is bulbous, no CMT.  Thin prep pap is not done. Uterus is surgically absent.  No adnexal masses or tenderness noted. Tinea infection in both groins. Extremities:  No swelling or varicosities noted Psych:  She has a normal mood and affect  Assessment:   Healthy well-woman exam Obesity Family history of colon and endometrial cancers. Tinea of groin Plan:  Labs obtained and will follow up accordingly F/U 1 year for AE, or sooner if needed   Laurie Hubbard, CNM

## 2017-05-30 NOTE — Telephone Encounter (Signed)
Mel her insurance wouldn't cover the terazol, change to something different??

## 2017-05-30 NOTE — Telephone Encounter (Signed)
Patient called stating that she missed a call from Amy, And would just like for Amy to give her a call back at her earliest convinence. No other information was advised. Please advise.

## 2017-05-30 NOTE — Telephone Encounter (Signed)
Can the pharmacy not change to generic?it should be covered?

## 2017-05-31 LAB — CA 125: Cancer Antigen (CA) 125: 7.5 U/mL (ref 0.0–38.1)

## 2017-05-31 LAB — CEA: CEA: 2.1 ng/mL (ref 0.0–4.7)

## 2017-05-31 LAB — CANCER ANTIGEN 15-3: CAN 15 3: 14.5 U/mL (ref 0.0–25.0)

## 2017-07-01 ENCOUNTER — Encounter: Payer: BC Managed Care – PPO | Admitting: Obstetrics and Gynecology

## 2017-08-11 ENCOUNTER — Other Ambulatory Visit: Payer: Self-pay | Admitting: Neurological Surgery

## 2017-08-11 DIAGNOSIS — M4316 Spondylolisthesis, lumbar region: Secondary | ICD-10-CM

## 2017-08-15 ENCOUNTER — Telehealth: Payer: Self-pay | Admitting: Obstetrics and Gynecology

## 2017-08-15 ENCOUNTER — Other Ambulatory Visit: Payer: Self-pay | Admitting: Obstetrics and Gynecology

## 2017-08-15 NOTE — Telephone Encounter (Signed)
The patient called and stated that her estradiol (ESTRACE) 0.5 MG tablet is expired. The patient also stated that she would like to speak with Amy. No other information was disclosed. Please advise.

## 2017-08-15 NOTE — Telephone Encounter (Signed)
Sent refill in 

## 2017-09-04 ENCOUNTER — Other Ambulatory Visit: Payer: BC Managed Care – PPO

## 2017-09-12 ENCOUNTER — Other Ambulatory Visit: Payer: Self-pay | Admitting: Neurological Surgery

## 2017-09-12 DIAGNOSIS — M4316 Spondylolisthesis, lumbar region: Secondary | ICD-10-CM

## 2017-09-15 ENCOUNTER — Ambulatory Visit: Payer: BC Managed Care – PPO

## 2017-09-20 ENCOUNTER — Ambulatory Visit
Admission: RE | Admit: 2017-09-20 | Discharge: 2017-09-20 | Disposition: A | Discharge status: Home / Self Care | Payer: BC Managed Care – PPO | Source: Ambulatory Visit | Attending: Neurological Surgery | Admitting: Neurological Surgery

## 2017-09-20 DIAGNOSIS — M4316 Spondylolisthesis, lumbar region: Secondary | ICD-10-CM

## 2017-10-08 ENCOUNTER — Ambulatory Visit
Admission: RE | Admit: 2017-10-08 | Discharge: 2017-10-08 | Disposition: A | Discharge status: Home / Self Care | Payer: BC Managed Care – PPO | Source: Ambulatory Visit | Attending: Obstetrics and Gynecology | Admitting: Obstetrics and Gynecology

## 2017-10-08 DIAGNOSIS — N6489 Other specified disorders of breast: Secondary | ICD-10-CM

## 2018-06-04 ENCOUNTER — Encounter: Payer: BC Managed Care – PPO | Admitting: Obstetrics and Gynecology

## 2018-06-17 ENCOUNTER — Encounter: Payer: BC Managed Care – PPO | Admitting: Obstetrics and Gynecology

## 2018-07-16 HISTORY — PX: COLONOSCOPY: SHX174

## 2018-07-22 ENCOUNTER — Encounter: Payer: Self-pay | Admitting: *Deleted

## 2018-07-22 ENCOUNTER — Encounter: Payer: BC Managed Care – PPO | Admitting: Obstetrics and Gynecology

## 2018-08-06 ENCOUNTER — Encounter: Payer: Self-pay | Admitting: Obstetrics and Gynecology

## 2018-08-10 ENCOUNTER — Telehealth: Payer: Self-pay | Admitting: Obstetrics and Gynecology

## 2018-08-10 ENCOUNTER — Encounter: Payer: Self-pay | Admitting: Obstetrics and Gynecology

## 2018-08-10 ENCOUNTER — Ambulatory Visit (INDEPENDENT_AMBULATORY_CARE_PROVIDER_SITE_OTHER): Payer: BC Managed Care – PPO | Admitting: Obstetrics and Gynecology

## 2018-08-10 VITALS — BP 150/81 | HR 80 | Ht 65.0 in | Wt 283.7 lb

## 2018-08-10 DIAGNOSIS — Z800 Family history of malignant neoplasm of digestive organs: Secondary | ICD-10-CM | POA: Diagnosis not present

## 2018-08-10 DIAGNOSIS — Z01419 Encounter for gynecological examination (general) (routine) without abnormal findings: Secondary | ICD-10-CM

## 2018-08-10 DIAGNOSIS — R300 Dysuria: Secondary | ICD-10-CM

## 2018-08-10 DIAGNOSIS — Z803 Family history of malignant neoplasm of breast: Secondary | ICD-10-CM

## 2018-08-10 DIAGNOSIS — Z8041 Family history of malignant neoplasm of ovary: Secondary | ICD-10-CM

## 2018-08-10 MED ORDER — FLUCONAZOLE 150 MG PO TABS: 150.0000 mg | ORAL_TABLET | Freq: Once | ORAL | 3 refills | Status: AC

## 2018-08-10 NOTE — Progress Notes (Signed)
Subjective:   Laurie Hubbard is a 52 y.o. G43P2 Caucasian female here for a routine well-woman exam.  No LMP recorded. Patient has had a hysterectomy.    Current complaints: still having back issues from fall at work. Recently got RFA injections. Also just took septra for UTI and thinks she has a yeats infection- took diflucan 2 days ago.  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  Past Medical History Past Medical History:  Diagnosis Date  . Arthritis    knees and left thumb  . Calculus of gallbladder with other cholecystitis, without mention of obstruction   . Complication of anesthesia    Diff intubation - 05/01/11 - [facility] -  see report in paper chart  . Hyperlipidemia   . Hypertension   . Pinched nerve    lower back  . Seasonal allergies     Past Surgical History Past Surgical History:  Procedure Laterality Date  . ABDOMINAL HYSTERECTOMY  1999  . BREAST BIOPSY Right   . CHOLECYSTECTOMY  2012   diff intubation - see records  . COLONOSCOPY  2013   Dr Vira Agar  . DILATION AND CURETTAGE OF UTERUS    . KNEE ARTHROSCOPY Left 02/10/2015   Procedure: ARTHROSCOPY KNEE;  Surgeon: Leanor Kail, MD;  Location: [place];  Service: Orthopedics;  Laterality: Left;  . TONSILLECTOMY  1971  . TUMOR EXCISION Right    Hip - benign    Gynecologic History G2P2  No LMP recorded. Patient has had a hysterectomy. Contraception: status post hysterectomy Last Pap: 2017. Results were: normal Last mammogram: 09/2017. Results were: normal   Obstetric  History OB History  Gravida Para Term Preterm AB Living  2 2       2   SAB TAB Ectopic Multiple Live Births               # Outcome Date GA Lbr Len/2nd Weight Sex Delivery Anes PTL Lv  2 Para           1 Para             Obstetric Comments  1st Menstrual Cycle:  9  1st Pregnancy: 17    Current Medications Current Outpatient Medications on File Prior to Visit  Medication Sig Dispense Refill  . cetirizine (ZYRTEC) 10 MG tablet Take 10 mg by mouth daily. AM    . Cholecalciferol (VITAMIN D PO) Take by mouth.    . estradiol (ESTRACE) 0.5 MG tablet take 1 tablet by mouth once daily 90 tablet 4  . gabapentin (NEURONTIN) 100 MG capsule Take 100 mg by mouth 3 (three) times daily.    Marland Kitchen lisinopril-hydrochlorothiazide (PRINZIDE,ZESTORETIC) 20-25 MG per tablet Take 1 tablet by mouth daily. AM    . meloxicam (MOBIC) 15 MG tablet Take 15 mg by mouth daily.    . Multiple Vitamin (MULTIVITAMIN) capsule Take 1 capsule by mouth daily.    Marland Kitchen PARoxetine (PAXIL) 10 MG tablet Take 1 tablet by mouth daily. AM    . simvastatin (ZOCOR) 10 MG tablet Take 1 tablet by mouth daily. PM     No current  facility-administered medications on file prior to visit.     Review of Systems Patient denies any headaches, blurred vision, shortness of breath, chest pain, abdominal pain, problems with bowel movements, urination, or intercourse.  Objective:  BP (!) 150/81   Pulse 80   Ht 5\' 5"  (1.651 m)   Wt 283 lb 11.2 oz (128.7 kg)   BMI 47.21 kg/m  Physical Exam  General:  Well developed, well nourished, no acute distress. She is alert and oriented x3. Skin:  Warm and dry Neck:  Midline trachea, no thyromegaly or nodules Cardiovascular: Regular rate and rhythm, no murmur heard Lungs:  Effort normal, all lung fields clear to auscultation bilaterally Breasts:  No dominant palpable mass, retraction, or nipple discharge Abdomen:  Soft, non tender, no hepatosplenomegaly or masses Pelvic:  External genitalia is normal  in appearance.  The vagina is normal in appearance. The cervix is bulbous, no CMT.  Thin prep pap is not done. Uterus is surgically absent.  No adnexal masses or tenderness noted.slight vaginal redness noted. Microscopic wet-mount exam shows negative for pathogens, normal epithelial cells, lactobacilli. Extremities:  No swelling or varicosities noted Psych:  She has a normal mood and affect UA negative  Assessment:   Healthy well-woman exam Obesity Vaginal irritation Dysuria Family history of ovarian cancer  Plan:  Will refill diflucan, urine sent for culture Pelvic ultrasound ordered for ovarian screening along with labs F/U 1 year for AE, or sooner if needed Mammogram ordered  Livio Ledwith Rockney Ghee, CNM

## 2018-08-10 NOTE — Telephone Encounter (Signed)
pls advsie

## 2018-08-10 NOTE — Telephone Encounter (Signed)
Sophia called from [facility] and asked for a clarification on a script from today's visit for this patient.  She asked if the provider was aware of the medication conflict between the Zocor and the fluconazole?  I tried to call the nurse and the provider but they were in clinic with a patient, and Sophia was fine with a call back, please advise, thanks.

## 2018-08-11 ENCOUNTER — Other Ambulatory Visit: Payer: Self-pay | Admitting: Obstetrics and Gynecology

## 2018-08-11 LAB — CA 125: Cancer Antigen (CA) 125: 8 U/mL (ref 0.0–38.1)

## 2018-08-11 LAB — CANCER ANTIGEN 15-3: CA 15-3: 14.4 U/mL (ref 0.0–25.0)

## 2018-08-11 MED ORDER — ESTRADIOL 0.5 MG PO TABS: 0.5000 mg | ORAL_TABLET | Freq: Every day | ORAL | 4 refills | Status: DC

## 2018-08-11 MED ORDER — TERCONAZOLE 0.4 % VA CREA: 1.0000 | TOPICAL_CREAM | Freq: Two times a day (BID) | VAGINAL | 5 refills | Status: DC

## 2018-08-11 NOTE — Telephone Encounter (Signed)
Yes. I am aware and feel like benefits outweigh risk especially with prn use

## 2018-08-12 ENCOUNTER — Other Ambulatory Visit: Payer: Self-pay | Admitting: Obstetrics and Gynecology

## 2018-08-12 LAB — URINE CULTURE: Organism ID, Bacteria: NO GROWTH

## 2018-08-12 MED ORDER — SULFAMETHOXAZOLE-TRIMETHOPRIM 800-160 MG PO TABS: 1.0000 | ORAL_TABLET | Freq: Two times a day (BID) | ORAL | 1 refills | Status: DC

## 2018-09-08 ENCOUNTER — Other Ambulatory Visit: Payer: Self-pay | Admitting: Obstetrics and Gynecology

## 2018-09-08 MED ORDER — TERCONAZOLE 0.4 % VA CREA: 1.0000 | TOPICAL_CREAM | Freq: Every day | VAGINAL | 0 refills | Status: DC

## 2018-09-11 ENCOUNTER — Other Ambulatory Visit: Payer: Self-pay | Admitting: Obstetrics and Gynecology

## 2018-09-11 DIAGNOSIS — Z1231 Encounter for screening mammogram for malignant neoplasm of breast: Secondary | ICD-10-CM

## 2018-10-14 ENCOUNTER — Ambulatory Visit
Admission: RE | Admit: 2018-10-14 | Discharge: 2018-10-14 | Disposition: A | Discharge status: Home / Self Care | Payer: BC Managed Care – PPO | Source: Ambulatory Visit | Attending: Obstetrics and Gynecology | Admitting: Obstetrics and Gynecology

## 2018-10-14 DIAGNOSIS — Z1231 Encounter for screening mammogram for malignant neoplasm of breast: Secondary | ICD-10-CM

## 2018-10-16 ENCOUNTER — Other Ambulatory Visit: Payer: Self-pay | Admitting: Obstetrics and Gynecology

## 2018-10-16 DIAGNOSIS — R928 Other abnormal and inconclusive findings on diagnostic imaging of breast: Secondary | ICD-10-CM

## 2018-10-23 ENCOUNTER — Ambulatory Visit
Admission: RE | Admit: 2018-10-23 | Discharge: 2018-10-23 | Disposition: A | Discharge status: Home / Self Care | Payer: BC Managed Care – PPO | Source: Ambulatory Visit | Attending: Obstetrics and Gynecology | Admitting: Obstetrics and Gynecology

## 2018-10-23 DIAGNOSIS — R928 Other abnormal and inconclusive findings on diagnostic imaging of breast: Secondary | ICD-10-CM

## 2018-11-06 ENCOUNTER — Other Ambulatory Visit: Payer: Self-pay | Admitting: Obstetrics and Gynecology

## 2019-10-21 ENCOUNTER — Other Ambulatory Visit: Payer: Self-pay | Admitting: Internal Medicine

## 2019-10-21 DIAGNOSIS — Z1231 Encounter for screening mammogram for malignant neoplasm of breast: Secondary | ICD-10-CM

## 2019-11-26 ENCOUNTER — Other Ambulatory Visit: Payer: Self-pay | Admitting: Internal Medicine

## 2019-11-26 ENCOUNTER — Other Ambulatory Visit: Payer: Self-pay

## 2019-11-26 ENCOUNTER — Other Ambulatory Visit: Payer: Self-pay | Admitting: Obstetrics & Gynecology

## 2019-11-26 ENCOUNTER — Ambulatory Visit
Admission: RE | Admit: 2019-11-26 | Discharge: 2019-11-26 | Disposition: A | Discharge status: Home / Self Care | Payer: Medicare Other | Source: Ambulatory Visit | Attending: Internal Medicine | Admitting: Internal Medicine

## 2019-11-26 DIAGNOSIS — Z1231 Encounter for screening mammogram for malignant neoplasm of breast: Secondary | ICD-10-CM

## 2019-11-26 DIAGNOSIS — N6489 Other specified disorders of breast: Secondary | ICD-10-CM

## 2019-12-15 ENCOUNTER — Other Ambulatory Visit: Payer: Self-pay

## 2019-12-15 ENCOUNTER — Ambulatory Visit
Admission: RE | Admit: 2019-12-15 | Discharge: 2019-12-15 | Disposition: A | Discharge status: Home / Self Care | Payer: Medicare Other | Source: Ambulatory Visit | Attending: Internal Medicine | Admitting: Internal Medicine

## 2019-12-15 DIAGNOSIS — N6489 Other specified disorders of breast: Secondary | ICD-10-CM

## 2019-12-20 ENCOUNTER — Other Ambulatory Visit: Payer: Self-pay | Admitting: General Surgery

## 2019-12-20 DIAGNOSIS — R928 Other abnormal and inconclusive findings on diagnostic imaging of breast: Secondary | ICD-10-CM

## 2020-06-29 ENCOUNTER — Other Ambulatory Visit: Payer: Self-pay | Admitting: Internal Medicine

## 2020-06-29 DIAGNOSIS — D497 Neoplasm of unspecified behavior of endocrine glands and other parts of nervous system: Secondary | ICD-10-CM

## 2020-07-25 ENCOUNTER — Other Ambulatory Visit: Payer: Self-pay

## 2020-07-25 ENCOUNTER — Encounter
Admission: RE | Admit: 2020-07-25 | Discharge: 2020-07-25 | Disposition: A | Discharge status: Home / Self Care | Payer: Medicare Other | Source: Ambulatory Visit | Attending: Internal Medicine | Admitting: Internal Medicine

## 2020-07-25 DIAGNOSIS — D497 Neoplasm of unspecified behavior of endocrine glands and other parts of nervous system: Secondary | ICD-10-CM

## 2020-07-25 MED ORDER — TECHNETIUM TC 99M SESTAMIBI - CARDIOLITE
25.0000 | Freq: Once | INTRAVENOUS | Status: AC | PRN
  Administered 2020-07-25: 11:00:00 25.358 via INTRAVENOUS

## 2021-02-09 ENCOUNTER — Other Ambulatory Visit: Payer: Self-pay | Admitting: Internal Medicine

## 2021-02-09 DIAGNOSIS — R928 Other abnormal and inconclusive findings on diagnostic imaging of breast: Secondary | ICD-10-CM

## 2021-03-21 ENCOUNTER — Other Ambulatory Visit: Payer: Self-pay | Admitting: Internal Medicine

## 2021-03-21 DIAGNOSIS — N6489 Other specified disorders of breast: Secondary | ICD-10-CM

## 2021-03-26 ENCOUNTER — Ambulatory Visit
Admission: RE | Admit: 2021-03-26 | Discharge: 2021-03-26 | Disposition: A | Discharge status: Home / Self Care | Payer: Medicare Other | Source: Ambulatory Visit | Attending: Internal Medicine | Admitting: Internal Medicine

## 2021-03-26 ENCOUNTER — Other Ambulatory Visit: Payer: Self-pay

## 2021-03-26 DIAGNOSIS — N6489 Other specified disorders of breast: Secondary | ICD-10-CM

## 2021-05-02 ENCOUNTER — Encounter: Payer: Self-pay | Admitting: Otolaryngology

## 2021-05-10 ENCOUNTER — Ambulatory Visit
Admission: RE | Admit: 2021-05-10 | Discharge: 2021-05-10 | Disposition: A | Discharge status: Home / Self Care | Payer: Medicare Other | Attending: Otolaryngology | Admitting: Otolaryngology

## 2021-05-10 ENCOUNTER — Encounter
Admission: RE | Disposition: A | Discharge status: Home / Self Care | Payer: Self-pay | Source: Home / Self Care | Attending: Otolaryngology

## 2021-05-10 ENCOUNTER — Other Ambulatory Visit: Payer: Self-pay

## 2021-05-10 ENCOUNTER — Ambulatory Visit: Payer: Medicare Other | Admitting: Anesthesiology

## 2021-05-10 ENCOUNTER — Encounter: Payer: Self-pay | Admitting: Otolaryngology

## 2021-05-10 DIAGNOSIS — Z87891 Personal history of nicotine dependence: Secondary | ICD-10-CM | POA: Insufficient documentation

## 2021-05-10 DIAGNOSIS — Z79899 Other long term (current) drug therapy: Secondary | ICD-10-CM | POA: Insufficient documentation

## 2021-05-10 DIAGNOSIS — D351 Benign neoplasm of parathyroid gland: Secondary | ICD-10-CM | POA: Diagnosis not present

## 2021-05-10 HISTORY — PX: PARATHYROIDECTOMY: SHX19

## 2021-05-10 SURGERY — PARATHYROIDECTOMY
Anesthesia: General | Site: Neck | Laterality: Left

## 2021-05-10 MED ORDER — LIDOCAINE HCL (CARDIAC) PF 100 MG/5ML IV SOSY
PREFILLED_SYRINGE | INTRAVENOUS | Status: DC | PRN
  Administered 2021-05-10: 50 mg via INTRAVENOUS

## 2021-05-10 MED ORDER — SCOPOLAMINE 1 MG/3DAYS TD PT72
1.0000 | MEDICATED_PATCH | Freq: Once | TRANSDERMAL | Status: DC
  Administered 2021-05-10: 1.5 mg via TRANSDERMAL

## 2021-05-10 MED ORDER — LACTATED RINGERS IV SOLN: INTRAVENOUS | Status: DC

## 2021-05-10 MED ORDER — SUCCINYLCHOLINE CHLORIDE 200 MG/10ML IV SOSY
PREFILLED_SYRINGE | INTRAVENOUS | Status: DC | PRN
Start: 2021-05-10 — End: 2021-05-10
  Administered 2021-05-10: 100 mg via INTRAVENOUS

## 2021-05-10 MED ORDER — MIDAZOLAM HCL 5 MG/5ML IJ SOLN
INTRAMUSCULAR | Status: DC | PRN
  Administered 2021-05-10: 2 mg via INTRAVENOUS

## 2021-05-10 MED ORDER — PROPOFOL 10 MG/ML IV BOLUS
INTRAVENOUS | Status: DC | PRN
  Administered 2021-05-10: 170 mg via INTRAVENOUS

## 2021-05-10 MED ORDER — LIDOCAINE-EPINEPHRINE (PF) 1 %-1:200000 IJ SOLN
INTRAMUSCULAR | Status: DC | PRN
  Administered 2021-05-10: 7 mL

## 2021-05-10 MED ORDER — GLYCOPYRROLATE 0.2 MG/ML IJ SOLN
INTRAMUSCULAR | Status: DC | PRN
  Administered 2021-05-10: .1 mg via INTRAVENOUS

## 2021-05-10 MED ORDER — HYDROCODONE-ACETAMINOPHEN 5-325 MG PO TABS: 1.0000 | ORAL_TABLET | ORAL | 0 refills | Status: AC | PRN

## 2021-05-10 MED ORDER — FENTANYL CITRATE (PF) 100 MCG/2ML IJ SOLN
INTRAMUSCULAR | Status: DC | PRN
  Administered 2021-05-10: 25 ug via INTRAVENOUS
  Administered 2021-05-10: 100 ug via INTRAVENOUS
  Administered 2021-05-10: 25 ug via INTRAVENOUS

## 2021-05-10 MED ORDER — BACITRACIN 500 UNIT/GM EX OINT
TOPICAL_OINTMENT | CUTANEOUS | Status: DC | PRN
  Administered 2021-05-10: 1 via TOPICAL

## 2021-05-10 MED ORDER — DEXAMETHASONE SODIUM PHOSPHATE 4 MG/ML IJ SOLN
INTRAMUSCULAR | Status: DC | PRN
  Administered 2021-05-10: 10 mg via INTRAVENOUS

## 2021-05-10 MED ORDER — CLINDAMYCIN PHOSPHATE 900 MG/50ML IV SOLN
INTRAVENOUS | Status: DC | PRN
  Administered 2021-05-10: 900 mg via INTRAVENOUS

## 2021-05-10 MED ORDER — 0.9 % SODIUM CHLORIDE (POUR BTL) OPTIME
TOPICAL | Status: DC | PRN
  Administered 2021-05-10: 1000 mL

## 2021-05-10 MED ORDER — ONDANSETRON HCL 4 MG/2ML IJ SOLN
INTRAMUSCULAR | Status: DC | PRN
  Administered 2021-05-10: 4 mg via INTRAVENOUS

## 2021-05-10 SURGICAL SUPPLY — 36 items
BLADE SURG 15 STRL LF DISP TIS (BLADE) ×1 IMPLANT
BLADE SURG 15 STRL SS (BLADE) ×2
CORD BIP STRL DISP 12FT (MISCELLANEOUS) ×2 IMPLANT
DRSG TEGADERM 2-3/8X2-3/4 SM (GAUZE/BANDAGES/DRESSINGS) ×4 IMPLANT
DRSG TEGADERM 6X8 (GAUZE/BANDAGES/DRESSINGS) ×2 IMPLANT
DRSG TELFA 3X8 NADH (GAUZE/BANDAGES/DRESSINGS) ×2 IMPLANT
ELECT NEEDLE 20X.3 GREEN (MISCELLANEOUS) ×2
ELECT REM PT RETURN 9FT ADLT (ELECTROSURGICAL) ×2
ELECTRODE NEEDLE 20X.3 GREEN (MISCELLANEOUS) ×1 IMPLANT
ELECTRODE REM PT RTRN 9FT ADLT (ELECTROSURGICAL) ×1 IMPLANT
GLOVE PROTEXIS LATEX SZ 7.5 (GLOVE) ×2 IMPLANT
GLOVE SURG ENC MOIS LTX SZ7.5 (GLOVE) ×2 IMPLANT
GLOVE SURG GAMMEX PI TX LF 7.5 (GLOVE) ×8 IMPLANT
GOWN STRL REUS W/ TWL LRG LVL3 (GOWN DISPOSABLE) ×3 IMPLANT
GOWN STRL REUS W/TWL LRG LVL3 (GOWN DISPOSABLE) ×6
HEMOSTAT SURGICEL 2X3 (HEMOSTASIS) ×2 IMPLANT
KIT TURNOVER KIT A (KITS) ×2 IMPLANT
MANIFOLD NEPTUNE II (INSTRUMENTS) ×2 IMPLANT
NS IRRIG 500ML POUR BTL (IV SOLUTION) ×2 IMPLANT
PACK ENT CUSTOM (PACKS) ×2 IMPLANT
PACK HEAD/NECK (MISCELLANEOUS) ×2 IMPLANT
PROBE NEUROSIGN BIPOL (MISCELLANEOUS) ×1 IMPLANT
PROBE NEUROSIGN BIPOLAR (MISCELLANEOUS) ×2
SHEARS HARMONIC 9CM CVD (BLADE) ×2 IMPLANT
SOL ANTI-FOG 6CC FOG-OUT (MISCELLANEOUS) ×1 IMPLANT
SOL FOG-OUT ANTI-FOG 6CC (MISCELLANEOUS) ×1
SPONGE KITTNER 5P (MISCELLANEOUS) ×2 IMPLANT
SPONGE T-LAP 18X18 ~~LOC~~+RFID (SPONGE) ×2 IMPLANT
SUT PROLENE 5 0 P 3 (SUTURE) ×2 IMPLANT
SUT SILK 2 0 (SUTURE) ×2
SUT SILK 2-0 18XBRD TIE 12 (SUTURE) ×1 IMPLANT
SUT SILK 3 0 (SUTURE) ×2
SUT SILK 3-0 18XBRD TIE 12 (SUTURE) ×1 IMPLANT
SUT VIC AB 4-0 RB1 18 (SUTURE) ×2 IMPLANT
WATER STERILE IRR 500ML POUR (IV SOLUTION) ×2 IMPLANT
laryngeal Surface electrode ×2 IMPLANT

## 2021-05-10 NOTE — Anesthesia Preprocedure Evaluation (Signed)
Anesthesia Evaluation  Patient identified by MRN, date of birth, ID band Patient awake  General Assessment Comment:Success with Glidescope  Reviewed: Allergy & Precautions, H&P , NPO status , Patient's Chart, lab work & pertinent test results  History of Anesthesia Complications (+) DIFFICULT AIRWAY and history of anesthetic complications  Airway Mallampati: II  TM Distance: >3 FB Neck ROM: full    Dental no notable dental hx.    Pulmonary former smoker,    Pulmonary exam normal breath sounds clear to auscultation       Cardiovascular hypertension, Normal cardiovascular exam Rhythm:regular Rate:Normal     Neuro/Psych    GI/Hepatic GERD  ,  Endo/Other  Morbid obesity  Renal/GU      Musculoskeletal   Abdominal   Peds  Hematology   Anesthesia Other Findings   Reproductive/Obstetrics                             Anesthesia Physical Anesthesia Plan  ASA: 2  Anesthesia Plan: General ETT   Post-op Pain Management:    Induction:   PONV Risk Score and Plan: 3 and Treatment may vary due to age or medical condition, Ondansetron, Dexamethasone and Scopolamine patch - Pre-op  Airway Management Planned:   Additional Equipment:   Intra-op Plan:   Post-operative Plan:   Informed Consent: I have reviewed the patients History and Physical, chart, labs and discussed the procedure including the risks, benefits and alternatives for the proposed anesthesia with the patient or authorized representative who has indicated his/her understanding and acceptance.     Dental Advisory Given  Plan Discussed with: CRNA  Anesthesia Plan Comments:         Anesthesia Quick Evaluation

## 2021-05-10 NOTE — H&P (Signed)
H&P has been reviewed and patient reevaluated, no changes necessary. To be downloaded later.  

## 2021-05-10 NOTE — Transfer of Care (Signed)
Immediate Anesthesia Transfer of Care Note  Patient: Laurie Hubbard  Procedure(s) Performed: PARATHYROIDECTOMY (Left: Neck)  Patient Location: PACU  Anesthesia Type: General ETT  Level of Consciousness: awake, alert  and patient cooperative  Airway and Oxygen Therapy: Patient Spontanous Breathing and Patient connected to supplemental oxygen  Post-op Assessment: Post-op Vital signs reviewed, Patient's Cardiovascular Status Stable, Respiratory Function Stable, Patent Airway and No signs of Nausea or vomiting  Post-op Vital Signs: Reviewed and stable  Complications: No notable events documented.

## 2021-05-10 NOTE — Anesthesia Postprocedure Evaluation (Signed)
Anesthesia Post Note  Patient: Laurie Hubbard  Procedure(s) Performed: PARATHYROIDECTOMY (Left: Neck)     Patient location during evaluation: PACU Anesthesia Type: General Level of consciousness: awake and alert and oriented Pain management: satisfactory to patient Vital Signs Assessment: post-procedure vital signs reviewed and stable Respiratory status: spontaneous breathing, nonlabored ventilation and respiratory function stable Cardiovascular status: blood pressure returned to baseline and stable Postop Assessment: Adequate PO intake and No signs of nausea or vomiting Anesthetic complications: no   No notable events documented.  Raliegh Ip

## 2021-05-10 NOTE — Op Note (Signed)
05/10/2021  9:15 AM    Laurie Hubbard  LI:[id number]   Pre-Op Dx: Left parathyroid tumor  Post-op Dx: Same  Proc: Excision left parathyroid tumor through a minimally invasive incision and endoscopy  Surg:  Huey Romans      assist: Jeannie Fend Vaught  Anes:  GOT  EBL: 25 mL  Comp: None  Findings: Parathyroid mass found deep to the left upper lobe of the thyroid gland.  It was deep against the paraspinal muscles.  The left recurrent laryngeal nerve was found and stimulated before and after removal.  Procedure: The patient was brought to the operating room and placed in a supine position.  She was given general anesthesia by oral endotracheal intubation.  Once the patient was asleep a shoulder roll was placed to extend her neck some.  The skin was marked over a skin crease where the 1-1/2 inch incision was to be created in the horizontal fashion on the left lower neck.  She was prepped and draped in a sterile fashion.  5 mL of 1% Xylocaine with epi 1: 100,000 was used for infiltration in the subcu.  Incision was created through the skin and into the subcu fat.  This was divided with the harmonic and then the platysma muscle was divided with the harmonic as well.  The deep fatty tissue was separated until the anterior border of the sternocleidomastoid was found.  This was retracted posteriorly to find the strap muscles.  The strap muscles were divided in the midline and retracted laterally to get to the thyroid gland.  The gland was separated from the overlying muscle.  Small vessel attachments were cut with the harmonic.  The gland was dissected bluntly along its lateral border and freed up and rotated towards the midline some.  The dissection was carried along the middle to upper half of the thyroid gland.  Gland itself appeared normal and did not have any significant nodules that I could feel.  Once we got to the bottom side of the gland a soft tissue mass was visible in the deep tissues.   This was medial to the carotid and just overlying the deep muscle of the neck.  Just deep you could feel the spine.  This had some small vascular attachments to the thyroid gland that were separated.  The recurrent laryngeal nerve was found deep and inferior then was avoided.  The parathyroid mass was bluntly dissected from its surrounding tissue using a minimal amount of harmonic scalpel for small blood vessels.  The remaining attachments to the thyroid gland were freed up so that the entire mass could come out intact in its capsule.  The mass was sent for permanent section.  The nerve was still evident and stimulated well.  There was 1 small vessel on the lateral anterior thyroid that was oozing a little bit and electrocautery was used to control that.  The rest of the wound was very dry and had no evidence of bleeding.  No other masses were visible or palpable in the area.  Surgicel was placed into the wound bed and the tissues were allowed to close.  No drain was placed as this was very dry.  The wound was closed using 4-0 Vicryl's to close the platysma layer.  4-0 Vicryl's were then used to close the subcu layer with buried knots.  The skin edges were held in apposition with a 5-0 running locking Prolene suture.  The wound was covered with little bit of bacitracin followed by  Telfa and a Tegaderm.  The patient tolerated the procedure well.  She was awakened and taken to the recovery room in satisfactory condition.  There were no operative complications.  Dispo:   To PACU to be discharged home  Plan: To follow-up in the office in 1 week for suture removal.  She will rest at home with her head elevated.  I have written for some Vicodin for pain if needed but otherwise she can use Tylenol or her meloxicam.  [name]  05/10/2021 9:15 AM

## 2021-05-10 NOTE — Anesthesia Procedure Notes (Signed)
Procedure Name: Intubation Date/Time: 05/10/2021 7:47 AM Performed by: Mayme Genta, CRNA Pre-anesthesia Checklist: Patient identified, Emergency Drugs available, Suction available, Patient being monitored and Timeout performed Patient Re-evaluated:Patient Re-evaluated prior to induction Oxygen Delivery Method: Circle system utilized Preoxygenation: Pre-oxygenation with 100% oxygen Induction Type: IV induction Ventilation: Mask ventilation without difficulty Laryngoscope Size: Glidescope and 3 Grade View: Grade I Tube type: Oral Rae Tube size: 7.0 mm Number of attempts: 1 Airway Equipment and Method: Stylet Placement Confirmation: ETT inserted through vocal cords under direct vision, positive ETCO2 and breath sounds checked- equal and bilateral Secured at: 21 cm Tube secured with: Tape Dental Injury: Teeth and Oropharynx as per pre-operative assessment  Comments: Grade I view with glidescope. 7.0 ETT with nerve monitoring wrap inserted without difficulty.=/+ BBS.

## 2021-05-11 ENCOUNTER — Encounter: Payer: Self-pay | Admitting: Otolaryngology

## 2021-05-14 ENCOUNTER — Encounter: Payer: Self-pay | Admitting: Otolaryngology

## 2021-05-14 LAB — SURGICAL PATHOLOGY

## 2021-05-14 MED ORDER — HEMOSTATIC AGENTS (NO CHARGE) OPTIME
TOPICAL | Status: DC | PRN
  Administered 2021-05-10: 1 via TOPICAL

## 2021-06-08 ENCOUNTER — Other Ambulatory Visit: Payer: Self-pay | Admitting: Internal Medicine

## 2021-06-08 DIAGNOSIS — R928 Other abnormal and inconclusive findings on diagnostic imaging of breast: Secondary | ICD-10-CM

## 2021-06-24 ENCOUNTER — Ambulatory Visit
Admission: RE | Admit: 2021-06-24 | Discharge: 2021-06-24 | Disposition: A | Discharge status: Home / Self Care | Payer: Medicare Other | Source: Ambulatory Visit | Attending: Internal Medicine | Admitting: Internal Medicine

## 2021-06-24 DIAGNOSIS — R928 Other abnormal and inconclusive findings on diagnostic imaging of breast: Secondary | ICD-10-CM

## 2021-06-24 MED ORDER — GADOBUTROL 1 MMOL/ML IV SOLN
10.0000 mL | Freq: Once | INTRAVENOUS | Status: AC | PRN
  Administered 2021-06-24: 10 mL via INTRAVENOUS

## 2021-07-03 ENCOUNTER — Other Ambulatory Visit: Payer: Self-pay | Admitting: General Surgery

## 2021-07-03 DIAGNOSIS — N6489 Other specified disorders of breast: Secondary | ICD-10-CM

## 2021-07-23 ENCOUNTER — Other Ambulatory Visit: Payer: Self-pay | Admitting: General Surgery

## 2021-07-23 DIAGNOSIS — R928 Other abnormal and inconclusive findings on diagnostic imaging of breast: Secondary | ICD-10-CM

## 2021-08-06 ENCOUNTER — Other Ambulatory Visit: Payer: Medicare Other

## 2021-08-26 HISTORY — PX: BREAST BIOPSY: SHX20

## 2021-12-14 ENCOUNTER — Other Ambulatory Visit: Payer: Self-pay | Admitting: Internal Medicine

## 2021-12-14 DIAGNOSIS — Z1231 Encounter for screening mammogram for malignant neoplasm of breast: Secondary | ICD-10-CM

## 2022-01-08 IMAGING — CT NM PARATHYROID W/ SPECT
1 series · 12 of 14 positions shown, 15 images · non-contrast
Comparison: None

CLINICAL DATA: Parathyroid tumor, hypercalcemia

EXAM:
NM PARATHYROID SCINTIGRAPHY AND SPECT IMAGING
TECHNIQUE: Following intravenous administration of radiopharmaceutical, early
and 2-hour delayed planar images were obtained in the anterior
projection. Delayed triplanar SPECT images were also obtained at 2
hours. Concomitant CT imaging was performed. Fused SPECT/CT data
sets are reviewed in 3 planes.
RADIOPHARMACEUTICALS:  25.358 mCi Wc-SSm Sestamibi IV

[Series 3: 3d parathroid 1.25 b31s · axial · 0.98mm/px · z∈[+1498,+1826]mm · 12 of 486 slices shown, 15 images]
[im 38/486  soft-tissue]
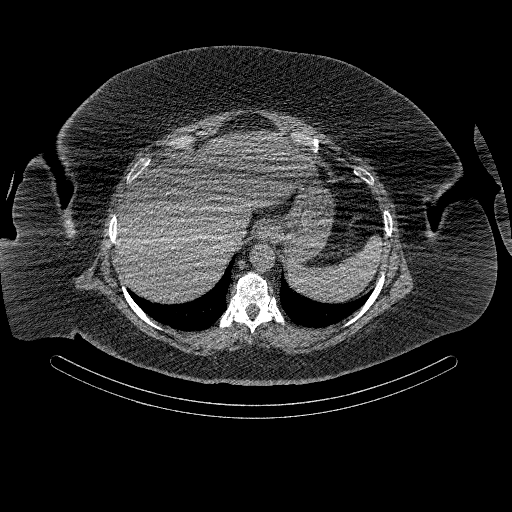
[im 38/486  bone]
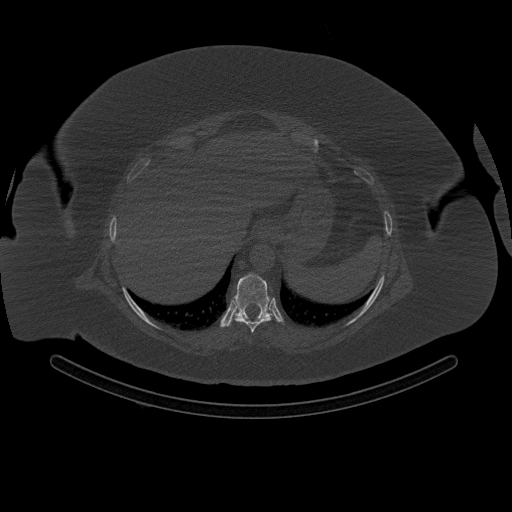
[im 75/486  bone]
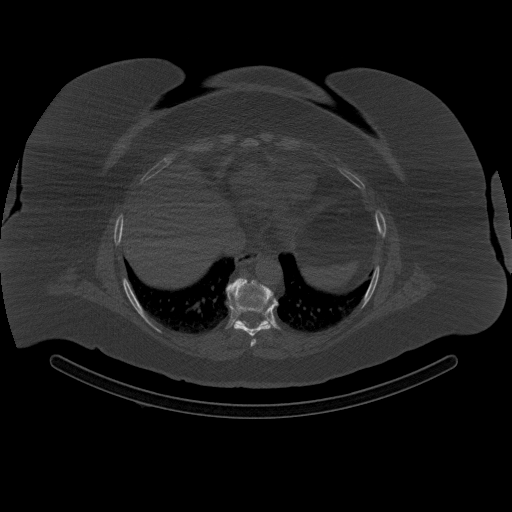
[im 112/486  bone]
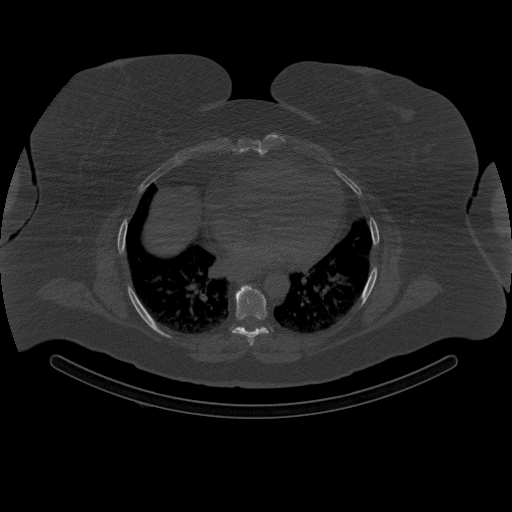
[im 150/486  bone]
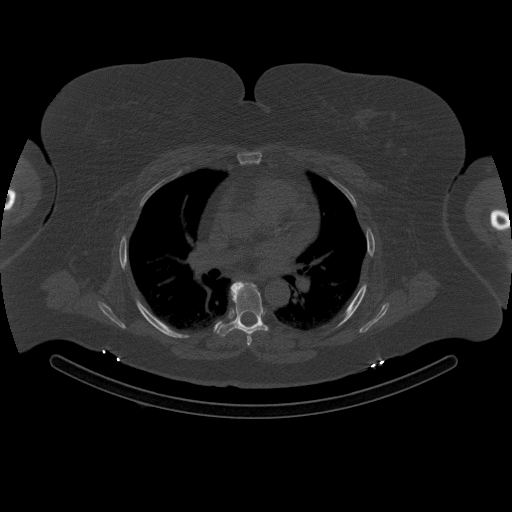
[im 187/486  soft-tissue]
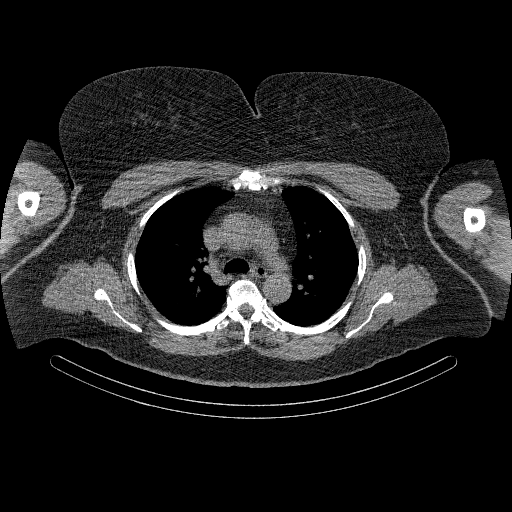
[im 187/486  bone]
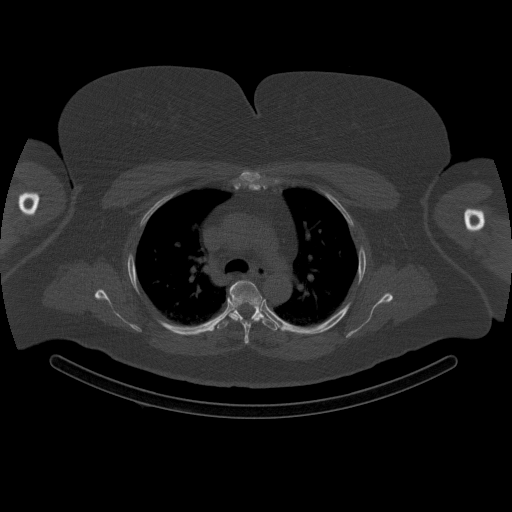
[im 224/486  bone]
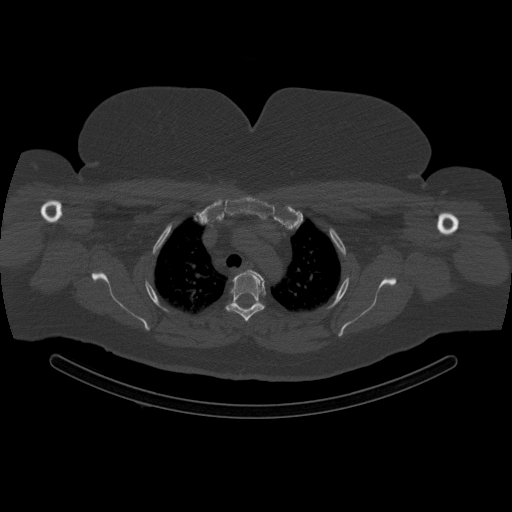
[im 262/486  bone]
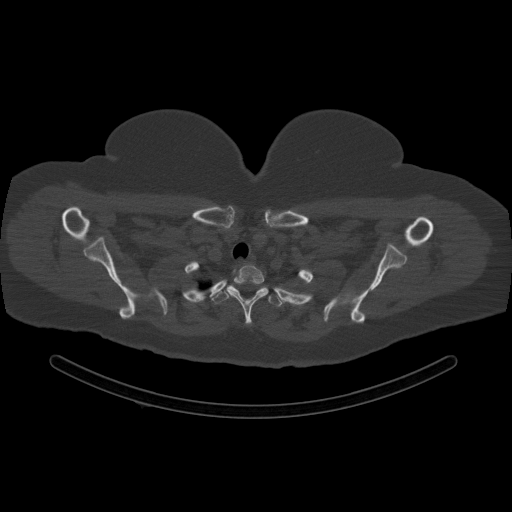
[im 299/486  bone]
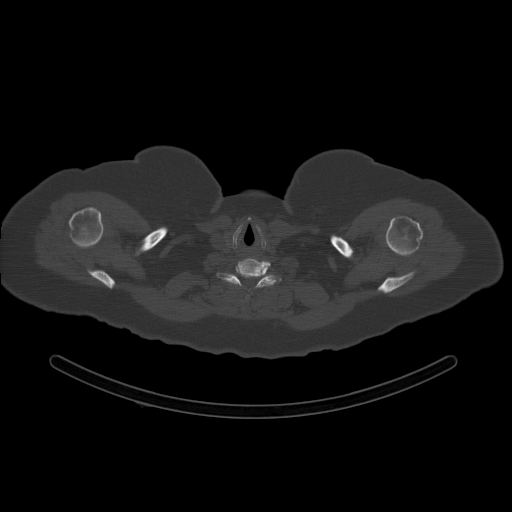
[im 336/486  soft-tissue]
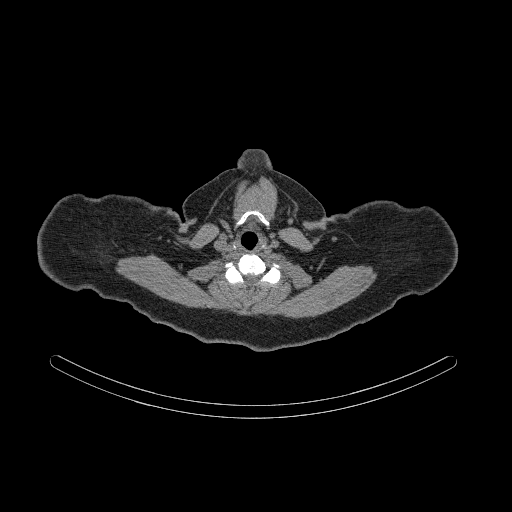
[im 336/486  bone]
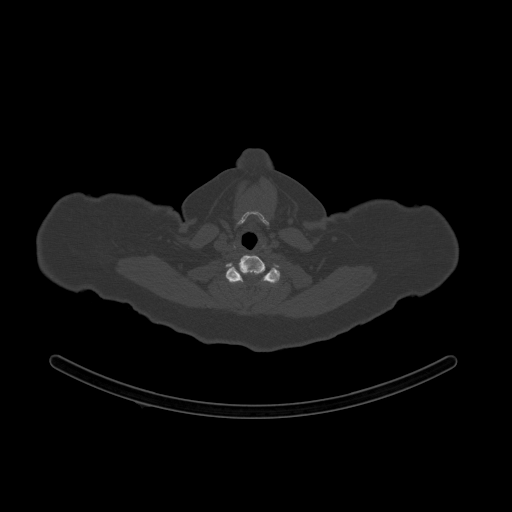
[im 374/486  bone]
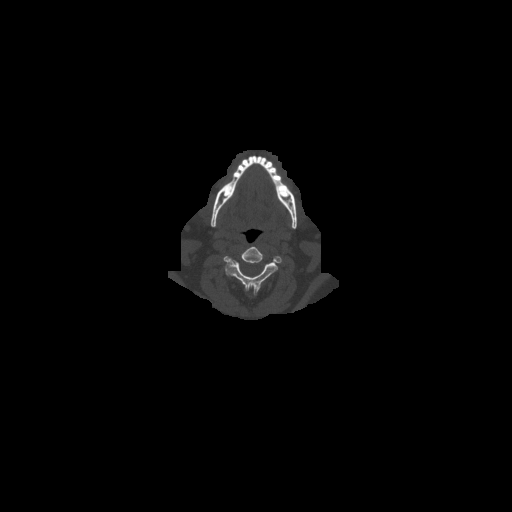
[im 411/486  bone]
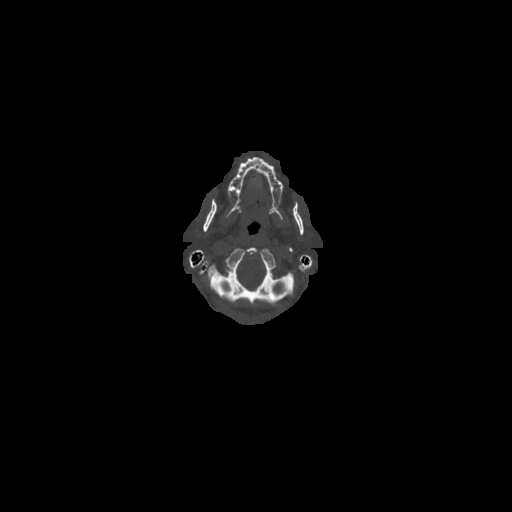
[im 448/486  bone]
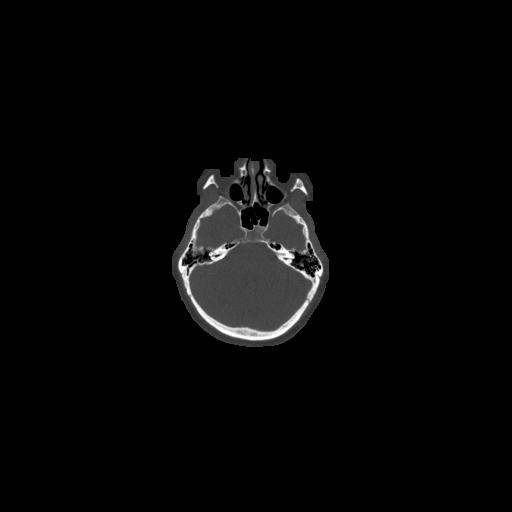

[12 of 14 positions shown; findings below may reference images not displayed]

FINDINGS: Planar imaging: Slightly asymmetric distribution of sestamibi on
initial images, greater in RIGHT lobe than LEFT. Washout of tracer
is seen from the thyroid tissue bilaterally, with questionable mild
retained tracer within the upper to mid aspect of the LEFT lobe.

SPECT imaging with CT: 9 x 6 mm diameter nodule is seen posterior to
the upper to mid LEFT thyroid lobe on CT and demonstrates
accumulation of sestamibi on SPECT imaging. Finding is suspicious
for a small parathyroid adenoma at the superior LEFT parathyroid
gland. No abnormal sestamibi Ree tension at the remaining
parathyroid positions. No ectopic localization of tracer in the
mediastinum.

Additional CT being hardening artifacts of dental origin. Visualized
intracranial structures unremarkable. No definite cervical
adenopathy or mass. Lungs clear. Mediastinum unremarkable.
Atherosclerotic calcification aorta and minimally in coronary
arteries. No definite osseous abnormalities.
IMPRESSION: Suspicion of a 9 x 6 mm LEFT superior parathyroid adenoma as above.

## 2022-01-17 ENCOUNTER — Other Ambulatory Visit: Payer: Self-pay | Admitting: Internal Medicine

## 2022-01-17 DIAGNOSIS — N6489 Other specified disorders of breast: Secondary | ICD-10-CM

## 2022-03-27 ENCOUNTER — Ambulatory Visit: Payer: Medicare Other

## 2022-03-28 ENCOUNTER — Other Ambulatory Visit: Payer: Self-pay | Admitting: Internal Medicine

## 2022-03-28 ENCOUNTER — Ambulatory Visit
Admission: RE | Admit: 2022-03-28 | Discharge: 2022-03-28 | Disposition: A | Discharge status: Home / Self Care | Payer: Medicare Other | Source: Ambulatory Visit | Attending: Internal Medicine | Admitting: Internal Medicine

## 2022-03-28 DIAGNOSIS — N6489 Other specified disorders of breast: Secondary | ICD-10-CM

## 2022-03-28 DIAGNOSIS — R928 Other abnormal and inconclusive findings on diagnostic imaging of breast: Secondary | ICD-10-CM

## 2022-04-04 ENCOUNTER — Other Ambulatory Visit: Payer: Medicare Other

## 2022-05-01 ENCOUNTER — Ambulatory Visit
Admission: RE | Admit: 2022-05-01 | Discharge: 2022-05-01 | Disposition: A | Discharge status: Home / Self Care | Payer: Medicare Other | Source: Ambulatory Visit | Attending: General Surgery | Admitting: General Surgery

## 2022-05-01 DIAGNOSIS — R928 Other abnormal and inconclusive findings on diagnostic imaging of breast: Secondary | ICD-10-CM

## 2022-05-01 MED ORDER — GADOBUTROL 1 MMOL/ML IV SOLN: 10.0000 mL | Freq: Once | INTRAVENOUS | Status: DC | PRN

## 2022-07-03 DIAGNOSIS — J301 Allergic rhinitis due to pollen: Secondary | ICD-10-CM | POA: Diagnosis not present

## 2022-07-09 DIAGNOSIS — R7309 Other abnormal glucose: Secondary | ICD-10-CM | POA: Diagnosis not present

## 2022-07-09 DIAGNOSIS — Z0189 Encounter for other specified special examinations: Secondary | ICD-10-CM | POA: Diagnosis not present

## 2022-07-09 DIAGNOSIS — I10 Essential (primary) hypertension: Secondary | ICD-10-CM | POA: Diagnosis not present

## 2022-07-09 DIAGNOSIS — Z79899 Other long term (current) drug therapy: Secondary | ICD-10-CM | POA: Diagnosis not present

## 2022-07-09 DIAGNOSIS — E782 Mixed hyperlipidemia: Secondary | ICD-10-CM | POA: Diagnosis not present

## 2022-07-09 DIAGNOSIS — E559 Vitamin D deficiency, unspecified: Secondary | ICD-10-CM | POA: Diagnosis not present

## 2022-07-12 DIAGNOSIS — J301 Allergic rhinitis due to pollen: Secondary | ICD-10-CM | POA: Diagnosis not present

## 2022-07-25 DIAGNOSIS — J301 Allergic rhinitis due to pollen: Secondary | ICD-10-CM | POA: Diagnosis not present

## 2022-08-07 DIAGNOSIS — J301 Allergic rhinitis due to pollen: Secondary | ICD-10-CM | POA: Diagnosis not present

## 2022-09-11 DIAGNOSIS — J301 Allergic rhinitis due to pollen: Secondary | ICD-10-CM | POA: Diagnosis not present

## 2022-09-30 DIAGNOSIS — J301 Allergic rhinitis due to pollen: Secondary | ICD-10-CM | POA: Diagnosis not present

## 2022-10-09 DIAGNOSIS — J301 Allergic rhinitis due to pollen: Secondary | ICD-10-CM | POA: Diagnosis not present

## 2022-10-14 DIAGNOSIS — E785 Hyperlipidemia, unspecified: Secondary | ICD-10-CM | POA: Diagnosis not present

## 2022-10-14 DIAGNOSIS — I10 Essential (primary) hypertension: Secondary | ICD-10-CM | POA: Diagnosis not present

## 2022-10-14 DIAGNOSIS — R7309 Other abnormal glucose: Secondary | ICD-10-CM | POA: Diagnosis not present

## 2022-10-14 DIAGNOSIS — Z79899 Other long term (current) drug therapy: Secondary | ICD-10-CM | POA: Diagnosis not present

## 2022-10-14 DIAGNOSIS — Z0000 Encounter for general adult medical examination without abnormal findings: Secondary | ICD-10-CM | POA: Diagnosis not present

## 2022-10-14 DIAGNOSIS — E559 Vitamin D deficiency, unspecified: Secondary | ICD-10-CM | POA: Diagnosis not present

## 2022-10-16 ENCOUNTER — Other Ambulatory Visit: Payer: Self-pay | Admitting: Internal Medicine

## 2022-10-16 DIAGNOSIS — K219 Gastro-esophageal reflux disease without esophagitis: Secondary | ICD-10-CM

## 2022-10-30 ENCOUNTER — Ambulatory Visit: Admission: RE | Admit: 2022-10-30 | Payer: Medicare Other | Source: Ambulatory Visit

## 2022-10-31 DIAGNOSIS — K080 Exfoliation of teeth due to systemic causes: Secondary | ICD-10-CM | POA: Diagnosis not present

## 2022-10-31 DIAGNOSIS — J301 Allergic rhinitis due to pollen: Secondary | ICD-10-CM | POA: Diagnosis not present

## 2022-11-06 DIAGNOSIS — K080 Exfoliation of teeth due to systemic causes: Secondary | ICD-10-CM | POA: Diagnosis not present

## 2022-11-07 ENCOUNTER — Ambulatory Visit: Payer: Medicare Other

## 2022-11-11 DIAGNOSIS — D351 Benign neoplasm of parathyroid gland: Secondary | ICD-10-CM | POA: Diagnosis not present

## 2022-11-11 DIAGNOSIS — H6123 Impacted cerumen, bilateral: Secondary | ICD-10-CM | POA: Diagnosis not present

## 2022-11-11 DIAGNOSIS — J301 Allergic rhinitis due to pollen: Secondary | ICD-10-CM | POA: Diagnosis not present

## 2022-11-11 DIAGNOSIS — E210 Primary hyperparathyroidism: Secondary | ICD-10-CM | POA: Diagnosis not present

## 2022-11-12 DIAGNOSIS — R635 Abnormal weight gain: Secondary | ICD-10-CM | POA: Diagnosis not present

## 2022-11-12 DIAGNOSIS — Z01411 Encounter for gynecological examination (general) (routine) with abnormal findings: Secondary | ICD-10-CM | POA: Diagnosis not present

## 2022-11-12 DIAGNOSIS — Z1331 Encounter for screening for depression: Secondary | ICD-10-CM | POA: Diagnosis not present

## 2022-11-12 DIAGNOSIS — N393 Stress incontinence (female) (male): Secondary | ICD-10-CM | POA: Diagnosis not present

## 2022-11-12 DIAGNOSIS — N951 Menopausal and female climacteric states: Secondary | ICD-10-CM | POA: Diagnosis not present

## 2022-11-28 DIAGNOSIS — J301 Allergic rhinitis due to pollen: Secondary | ICD-10-CM | POA: Diagnosis not present

## 2022-12-09 DIAGNOSIS — K080 Exfoliation of teeth due to systemic causes: Secondary | ICD-10-CM | POA: Diagnosis not present

## 2022-12-24 DIAGNOSIS — J301 Allergic rhinitis due to pollen: Secondary | ICD-10-CM | POA: Diagnosis not present

## 2022-12-26 DIAGNOSIS — J301 Allergic rhinitis due to pollen: Secondary | ICD-10-CM | POA: Diagnosis not present

## 2023-01-06 DIAGNOSIS — K080 Exfoliation of teeth due to systemic causes: Secondary | ICD-10-CM | POA: Diagnosis not present

## 2023-01-16 DIAGNOSIS — J301 Allergic rhinitis due to pollen: Secondary | ICD-10-CM | POA: Diagnosis not present

## 2023-02-06 DIAGNOSIS — J301 Allergic rhinitis due to pollen: Secondary | ICD-10-CM | POA: Diagnosis not present

## 2023-02-19 DIAGNOSIS — Z79899 Other long term (current) drug therapy: Secondary | ICD-10-CM | POA: Diagnosis not present

## 2023-02-19 DIAGNOSIS — E782 Mixed hyperlipidemia: Secondary | ICD-10-CM | POA: Diagnosis not present

## 2023-02-19 DIAGNOSIS — R8290 Unspecified abnormal findings in urine: Secondary | ICD-10-CM | POA: Diagnosis not present

## 2023-02-19 DIAGNOSIS — R7303 Prediabetes: Secondary | ICD-10-CM | POA: Diagnosis not present

## 2023-02-19 DIAGNOSIS — E213 Hyperparathyroidism, unspecified: Secondary | ICD-10-CM | POA: Diagnosis not present

## 2023-02-26 DIAGNOSIS — J301 Allergic rhinitis due to pollen: Secondary | ICD-10-CM | POA: Diagnosis not present

## 2023-03-10 DIAGNOSIS — K080 Exfoliation of teeth due to systemic causes: Secondary | ICD-10-CM | POA: Diagnosis not present

## 2023-03-13 DIAGNOSIS — R7303 Prediabetes: Secondary | ICD-10-CM | POA: Diagnosis not present

## 2023-03-13 DIAGNOSIS — I10 Essential (primary) hypertension: Secondary | ICD-10-CM | POA: Diagnosis not present

## 2023-03-13 DIAGNOSIS — Z0000 Encounter for general adult medical examination without abnormal findings: Secondary | ICD-10-CM | POA: Diagnosis not present

## 2023-03-13 DIAGNOSIS — E785 Hyperlipidemia, unspecified: Secondary | ICD-10-CM | POA: Diagnosis not present

## 2023-03-14 ENCOUNTER — Other Ambulatory Visit: Payer: Self-pay | Admitting: Internal Medicine

## 2023-03-14 DIAGNOSIS — N6099 Unspecified benign mammary dysplasia of unspecified breast: Secondary | ICD-10-CM

## 2023-03-20 DIAGNOSIS — J301 Allergic rhinitis due to pollen: Secondary | ICD-10-CM | POA: Diagnosis not present

## 2023-04-10 DIAGNOSIS — J301 Allergic rhinitis due to pollen: Secondary | ICD-10-CM | POA: Diagnosis not present

## 2023-05-01 DIAGNOSIS — J301 Allergic rhinitis due to pollen: Secondary | ICD-10-CM | POA: Diagnosis not present

## 2023-05-07 ENCOUNTER — Ambulatory Visit
Admission: RE | Admit: 2023-05-07 | Discharge: 2023-05-07 | Disposition: A | Discharge status: Home / Self Care | Payer: Medicare Other | Source: Ambulatory Visit | Attending: Internal Medicine | Admitting: Internal Medicine

## 2023-05-07 DIAGNOSIS — N6099 Unspecified benign mammary dysplasia of unspecified breast: Secondary | ICD-10-CM

## 2023-05-07 DIAGNOSIS — R928 Other abnormal and inconclusive findings on diagnostic imaging of breast: Secondary | ICD-10-CM | POA: Diagnosis not present

## 2023-05-07 DIAGNOSIS — Z9889 Other specified postprocedural states: Secondary | ICD-10-CM | POA: Diagnosis not present

## 2023-05-12 DIAGNOSIS — H6123 Impacted cerumen, bilateral: Secondary | ICD-10-CM | POA: Diagnosis not present

## 2023-05-12 DIAGNOSIS — D351 Benign neoplasm of parathyroid gland: Secondary | ICD-10-CM | POA: Diagnosis not present

## 2023-05-12 DIAGNOSIS — J301 Allergic rhinitis due to pollen: Secondary | ICD-10-CM | POA: Diagnosis not present

## 2023-05-15 ENCOUNTER — Other Ambulatory Visit: Payer: Self-pay | Admitting: Otolaryngology

## 2023-05-15 DIAGNOSIS — E079 Disorder of thyroid, unspecified: Secondary | ICD-10-CM

## 2023-05-15 DIAGNOSIS — J301 Allergic rhinitis due to pollen: Secondary | ICD-10-CM | POA: Diagnosis not present

## 2023-05-19 DIAGNOSIS — Z8639 Personal history of other endocrine, nutritional and metabolic disease: Secondary | ICD-10-CM | POA: Diagnosis not present

## 2023-05-19 DIAGNOSIS — Z6841 Body Mass Index (BMI) 40.0 and over, adult: Secondary | ICD-10-CM | POA: Diagnosis not present

## 2023-05-19 DIAGNOSIS — R6889 Other general symptoms and signs: Secondary | ICD-10-CM | POA: Diagnosis not present

## 2023-05-21 DIAGNOSIS — G5602 Carpal tunnel syndrome, left upper limb: Secondary | ICD-10-CM | POA: Diagnosis not present

## 2023-05-27 DIAGNOSIS — M1712 Unilateral primary osteoarthritis, left knee: Secondary | ICD-10-CM

## 2023-05-27 HISTORY — DX: Unilateral primary osteoarthritis, left knee: M17.12

## 2023-05-28 DIAGNOSIS — Z23 Encounter for immunization: Secondary | ICD-10-CM | POA: Diagnosis not present

## 2023-06-05 DIAGNOSIS — J301 Allergic rhinitis due to pollen: Secondary | ICD-10-CM | POA: Diagnosis not present

## 2023-06-09 DIAGNOSIS — M170 Bilateral primary osteoarthritis of knee: Secondary | ICD-10-CM | POA: Diagnosis not present

## 2023-06-11 DIAGNOSIS — J301 Allergic rhinitis due to pollen: Secondary | ICD-10-CM | POA: Diagnosis not present

## 2023-06-13 DIAGNOSIS — E782 Mixed hyperlipidemia: Secondary | ICD-10-CM | POA: Diagnosis not present

## 2023-06-13 DIAGNOSIS — I10 Essential (primary) hypertension: Secondary | ICD-10-CM | POA: Diagnosis not present

## 2023-06-13 DIAGNOSIS — R7303 Prediabetes: Secondary | ICD-10-CM | POA: Diagnosis not present

## 2023-06-17 ENCOUNTER — Other Ambulatory Visit: Payer: Self-pay | Admitting: Surgery

## 2023-06-17 ENCOUNTER — Other Ambulatory Visit: Payer: Self-pay

## 2023-06-17 ENCOUNTER — Encounter
Admission: RE | Admit: 2023-06-17 | Discharge: 2023-06-17 | Disposition: A | Discharge status: Home / Self Care | Payer: Medicare Other | Source: Ambulatory Visit | Attending: Surgery | Admitting: Surgery

## 2023-06-17 VITALS — Ht 65.0 in | Wt 287.0 lb

## 2023-06-17 DIAGNOSIS — Z01818 Encounter for other preprocedural examination: Secondary | ICD-10-CM | POA: Insufficient documentation

## 2023-06-17 DIAGNOSIS — I10 Essential (primary) hypertension: Secondary | ICD-10-CM | POA: Insufficient documentation

## 2023-06-17 DIAGNOSIS — Z01812 Encounter for preprocedural laboratory examination: Secondary | ICD-10-CM

## 2023-06-17 HISTORY — DX: Vitamin D deficiency, unspecified: E55.9

## 2023-06-17 HISTORY — DX: Polyp of colon: K63.5

## 2023-06-17 HISTORY — DX: Morbid (severe) obesity due to excess calories: E66.01

## 2023-06-17 HISTORY — DX: Hyperparathyroidism, unspecified: E21.3

## 2023-06-17 HISTORY — DX: Mixed hyperlipidemia: E78.2

## 2023-06-17 HISTORY — DX: Prediabetes: R73.03

## 2023-06-17 HISTORY — DX: Anxiety disorder, unspecified: F41.9

## 2023-06-17 HISTORY — DX: Hypercalcemia: E83.52

## 2023-06-17 HISTORY — DX: Essential (primary) hypertension: I10

## 2023-06-17 LAB — CBC
HCT: 45 % (ref 36.0–46.0)
Hemoglobin: 14.7 g/dL (ref 12.0–15.0)
MCH: 30.9 pg (ref 26.0–34.0)
MCHC: 32.7 g/dL (ref 30.0–36.0)
MCV: 94.5 fL (ref 80.0–100.0)
Platelets: 311 10*3/uL (ref 150–400)
RBC: 4.76 MIL/uL (ref 3.87–5.11)
RDW: 13.2 % (ref 11.5–15.5)
WBC: 13.1 10*3/uL — ABNORMAL HIGH (ref 4.0–10.5)
nRBC: 0 % (ref 0.0–0.2)

## 2023-06-17 LAB — BASIC METABOLIC PANEL
Anion gap: 10 (ref 5–15)
BUN: 23 mg/dL — ABNORMAL HIGH (ref 6–20)
CO2: 27 mmol/L (ref 22–32)
Calcium: 9.6 mg/dL (ref 8.9–10.3)
Chloride: 100 mmol/L (ref 98–111)
Creatinine, Ser: 0.83 mg/dL (ref 0.44–1.00)
GFR, Estimated: >60 mL/min (ref >60)
Glucose, Bld: 120 mg/dL — ABNORMAL HIGH (ref 70–99)
Potassium: 3.7 mmol/L (ref 3.5–5.1)
Sodium: 137 mmol/L (ref 135–145)

## 2023-06-17 NOTE — Patient Instructions (Addendum)
Your procedure is scheduled on: Thursday, October 24 Report to the Registration Desk on the 1st floor of the [facility] To find out your arrival time, please call [phone] between 1PM - 3PM on: Wednesday, October 23 If your arrival time is 6:00 am, do not arrive before that time as the [facility] entrance doors do not open until 6:00 am.  REMEMBER: Instructions that are not followed completely may result in serious medical risk, up to and including death; or upon the discretion of your surgeon and anesthesiologist your surgery may need to be rescheduled.  Do not eat food after midnight the night before surgery.  No gum chewing or hard candies.  You may however, drink CLEAR liquids up to 2 hours before you are scheduled to arrive for your surgery. Do not drink anything within 2 hours of your scheduled arrival time.  Clear liquids include: - water  - apple juice without pulp - gatorade (not RED colors) - black coffee or tea (Do NOT add milk or creamers to the coffee or tea) Do NOT drink anything that is not on this list.  In addition, your doctor has ordered for you to drink the provided:  Ensure Pre-Surgery Clear Carbohydrate Drink  Drinking this carbohydrate drink up to two hours before surgery helps to reduce insulin resistance and improve patient outcomes. Please complete drinking 2 hours before scheduled arrival time.  One week prior to surgery: starting today, October 22 Stop meloxicam and Anti-inflammatories (NSAIDS) such as Advil, Aleve, Ibuprofen, Motrin, Naproxen, Naprosyn and Aspirin based products such as Excedrin, Goody's Powder, BC Powder. Stop ANY OVER THE COUNTER supplements until after surgery. Stop vitamin D, multiple vitamins, AZO, Qunol.  You may however, continue to take Tylenol if needed for pain up until the day of surgery.  Phentermine - stop now, October 22.   Continue taking all of your other prescription medications up until the day of  surgery.  ON THE DAY OF SURGERY ONLY TAKE THESE MEDICATIONS WITH SIPS OF WATER:  Propranolol  No Alcohol for 24 hours before or after surgery.  No Smoking including e-cigarettes for 24 hours before surgery.  No chewable tobacco products for at least 6 hours before surgery.  No nicotine patches on the day of surgery.  Do not use any "recreational" drugs for at least a week (preferably 2 weeks) before your surgery.  Please be advised that the combination of cocaine and anesthesia may have negative outcomes, up to and including death. If you test positive for cocaine, your surgery will be cancelled.  On the morning of surgery brush your teeth with toothpaste and water, you may rinse your mouth with mouthwash if you wish. Do not swallow any toothpaste or mouthwash.  Use CHG Soap as directed on instruction sheet.  Do not wear jewelry, make-up, hairpins, clips or nail polish.  For welded (permanent) jewelry: bracelets, anklets, waist bands, etc.  Please have this removed prior to surgery.  If it is not removed, there is a chance that hospital personnel will need to cut it off on the day of surgery.  Do not wear lotions, powders, or perfumes.   Do not shave body hair from the neck down 48 hours before surgery.  Contact lenses, hearing aids and dentures may not be worn into surgery.  Do not bring valuables to the hospital. [facility] is not responsible for any missing/lost belongings or valuables.   Notify your doctor if there is any change in your medical condition (cold,  fever, infection).  Wear comfortable clothing (specific to your surgery type) to the hospital.  After surgery, you can help prevent lung complications by doing breathing exercises.  Take deep breaths and cough every 1-2 hours. Your doctor may order a device called an Incentive Spirometer to help you take deep breaths.  If you are being discharged the day of surgery, you will not be allowed to drive home. You will  need a responsible individual to drive you home and stay with you for 24 hours after surgery.   If you are taking public transportation, you will need to have a responsible individual with you.  Please call the Pre-admissions Testing Dept. at [phone] if you have any questions about these instructions.  Surgery Visitation Policy:  Patients having surgery or a procedure may have two visitors.  Children under the age of 67 must have an adult with them who is not the patient.      Preparing for Surgery with CHLORHEXIDINE GLUCONATE (CHG) Soap  Chlorhexidine Gluconate (CHG) Soap  o An antiseptic cleaner that kills germs and bonds with the skin to continue killing germs even after washing  o Used for showering the night before surgery and morning of surgery  Before surgery, you can play an important role by reducing the number of germs on your skin.  CHG (Chlorhexidine gluconate) soap is an antiseptic cleanser which kills germs and bonds with the skin to continue killing germs even after washing.  Please do not use if you have an allergy to CHG or antibacterial soaps. If your skin becomes reddened/irritated stop using the CHG.  1. Shower the NIGHT BEFORE SURGERY and the MORNING OF SURGERY with CHG soap.  2. If you choose to wash your hair, wash your hair first as usual with your normal shampoo.  3. After shampooing, rinse your hair and body thoroughly to remove the shampoo.  4. Use CHG as you would any other liquid soap. You can apply CHG directly to the skin and wash gently with a scrungie or a clean washcloth.  5. Apply the CHG soap to your body only from the neck down. Do not use on open wounds or open sores. Avoid contact with your eyes, ears, mouth, and genitals (private parts). Wash face and genitals (private parts) with your normal soap.  6. Wash thoroughly, paying special attention to the area where your surgery will be performed.  7. Thoroughly rinse your body with  warm water.  8. Do not shower/wash with your normal soap after using and rinsing off the CHG soap.  9. Pat yourself dry with a clean towel.  10. Wear clean pajamas to bed the night before surgery.  12. Place clean sheets on your bed the night of your first shower and do not sleep with pets.  13. Shower again with the CHG soap on the day of surgery prior to arriving at the hospital.  14. Do not apply any deodorants/lotions/powders.  15. Please wear clean clothes to the hospital.

## 2023-06-19 ENCOUNTER — Other Ambulatory Visit: Payer: Self-pay

## 2023-06-19 ENCOUNTER — Ambulatory Visit
Admission: RE | Admit: 2023-06-19 | Discharge: 2023-06-19 | Disposition: A | Discharge status: Home / Self Care | Payer: Medicare Other | Source: Ambulatory Visit | Attending: Surgery | Admitting: Surgery

## 2023-06-19 ENCOUNTER — Ambulatory Visit: Payer: Medicare Other | Admitting: Urgent Care

## 2023-06-19 ENCOUNTER — Encounter: Payer: Self-pay | Admitting: Surgery

## 2023-06-19 ENCOUNTER — Ambulatory Visit: Payer: Medicare Other | Admitting: General Practice

## 2023-06-19 ENCOUNTER — Encounter
Admission: RE | Disposition: A | Discharge status: Home / Self Care | Payer: Self-pay | Source: Ambulatory Visit | Attending: Surgery

## 2023-06-19 DIAGNOSIS — Z87891 Personal history of nicotine dependence: Secondary | ICD-10-CM | POA: Insufficient documentation

## 2023-06-19 DIAGNOSIS — I10 Essential (primary) hypertension: Secondary | ICD-10-CM | POA: Diagnosis not present

## 2023-06-19 DIAGNOSIS — Z79899 Other long term (current) drug therapy: Secondary | ICD-10-CM | POA: Insufficient documentation

## 2023-06-19 DIAGNOSIS — K219 Gastro-esophageal reflux disease without esophagitis: Secondary | ICD-10-CM | POA: Insufficient documentation

## 2023-06-19 DIAGNOSIS — Z9049 Acquired absence of other specified parts of digestive tract: Secondary | ICD-10-CM | POA: Insufficient documentation

## 2023-06-19 DIAGNOSIS — G5602 Carpal tunnel syndrome, left upper limb: Secondary | ICD-10-CM | POA: Insufficient documentation

## 2023-06-19 DIAGNOSIS — Z90710 Acquired absence of both cervix and uterus: Secondary | ICD-10-CM | POA: Insufficient documentation

## 2023-06-19 DIAGNOSIS — R7303 Prediabetes: Secondary | ICD-10-CM | POA: Diagnosis not present

## 2023-06-19 HISTORY — PX: CARPAL TUNNEL RELEASE: SHX101

## 2023-06-19 SURGERY — RELEASE, CARPAL TUNNEL, ENDOSCOPIC
Anesthesia: General | Site: Wrist | Laterality: Left

## 2023-06-19 MED ORDER — ORAL CARE MOUTH RINSE: 15.0000 mL | Freq: Once | OROMUCOSAL | Status: AC

## 2023-06-19 MED ORDER — 0.9 % SODIUM CHLORIDE (POUR BTL) OPTIME
TOPICAL | Status: DC | PRN
  Administered 2023-06-19: 500 mL

## 2023-06-19 MED ORDER — FENTANYL CITRATE (PF) 100 MCG/2ML IJ SOLN
INTRAMUSCULAR | Status: AC
  Filled 2023-06-19: qty 2

## 2023-06-19 MED ORDER — PROPOFOL 10 MG/ML IV BOLUS
INTRAVENOUS | Status: AC
  Filled 2023-06-19: qty 20

## 2023-06-19 MED ORDER — MIDAZOLAM HCL 2 MG/2ML IJ SOLN
INTRAMUSCULAR | Status: DC | PRN
  Administered 2023-06-19: 2 mg via INTRAVENOUS

## 2023-06-19 MED ORDER — DEXTROSE-SODIUM CHLORIDE 5-0.9 % IV SOLN: INTRAVENOUS | Status: DC

## 2023-06-19 MED ORDER — LEVOFLOXACIN IN D5W 500 MG/100ML IV SOLN
INTRAVENOUS | Status: AC
  Filled 2023-06-19: qty 100

## 2023-06-19 MED ORDER — CHLORHEXIDINE GLUCONATE 0.12 % MT SOLN
15.0000 mL | Freq: Once | OROMUCOSAL | Status: AC
  Administered 2023-06-19: 15 mL via OROMUCOSAL

## 2023-06-19 MED ORDER — BUPIVACAINE HCL (PF) 0.5 % IJ SOLN
INTRAMUSCULAR | Status: AC
  Filled 2023-06-19: qty 30

## 2023-06-19 MED ORDER — KETOROLAC TROMETHAMINE 30 MG/ML IJ SOLN: 30.0000 mg | Freq: Once | INTRAMUSCULAR | Status: DC

## 2023-06-19 MED ORDER — LIDOCAINE HCL (CARDIAC) PF 100 MG/5ML IV SOSY
PREFILLED_SYRINGE | INTRAVENOUS | Status: DC | PRN
  Administered 2023-06-19: 100 mg via INTRAVENOUS

## 2023-06-19 MED ORDER — CHLORHEXIDINE GLUCONATE 0.12 % MT SOLN
OROMUCOSAL | Status: AC
  Filled 2023-06-19: qty 15

## 2023-06-19 MED ORDER — ONDANSETRON HCL 4 MG/2ML IJ SOLN: 4.0000 mg | Freq: Four times a day (QID) | INTRAMUSCULAR | Status: DC | PRN

## 2023-06-19 MED ORDER — MIDAZOLAM HCL 2 MG/2ML IJ SOLN
INTRAMUSCULAR | Status: AC
  Filled 2023-06-19: qty 2

## 2023-06-19 MED ORDER — FENTANYL CITRATE (PF) 100 MCG/2ML IJ SOLN: 25.0000 ug | INTRAMUSCULAR | Status: DC | PRN

## 2023-06-19 MED ORDER — PROPOFOL 10 MG/ML IV BOLUS
INTRAVENOUS | Status: DC | PRN
  Administered 2023-06-19: 70 mg via INTRAVENOUS
  Administered 2023-06-19: 170 mg via INTRAVENOUS
  Administered 2023-06-19: 30 mg via INTRAVENOUS

## 2023-06-19 MED ORDER — ACETAMINOPHEN 325 MG PO TABS: 325.0000 mg | ORAL_TABLET | Freq: Four times a day (QID) | ORAL | Status: DC | PRN

## 2023-06-19 MED ORDER — KETOROLAC TROMETHAMINE 30 MG/ML IJ SOLN
INTRAMUSCULAR | Status: DC | PRN
  Administered 2023-06-19: 30 mg via INTRAVENOUS

## 2023-06-19 MED ORDER — VANCOMYCIN HCL IN DEXTROSE 1-5 GM/200ML-% IV SOLN
INTRAVENOUS | Status: AC
  Filled 2023-06-19: qty 200

## 2023-06-19 MED ORDER — OXYCODONE HCL 5 MG/5ML PO SOLN: 5.0000 mg | Freq: Once | ORAL | Status: DC | PRN

## 2023-06-19 MED ORDER — ONDANSETRON HCL 4 MG PO TABS: 4.0000 mg | ORAL_TABLET | Freq: Four times a day (QID) | ORAL | Status: DC | PRN

## 2023-06-19 MED ORDER — METOCLOPRAMIDE HCL 10 MG PO TABS: 5.0000 mg | ORAL_TABLET | Freq: Three times a day (TID) | ORAL | Status: DC | PRN

## 2023-06-19 MED ORDER — DEXAMETHASONE SODIUM PHOSPHATE 10 MG/ML IJ SOLN
INTRAMUSCULAR | Status: DC | PRN
  Administered 2023-06-19: 8 mg via INTRAVENOUS

## 2023-06-19 MED ORDER — DEXMEDETOMIDINE HCL IN NACL 80 MCG/20ML IV SOLN
INTRAVENOUS | Status: DC | PRN
Start: 2023-06-19 — End: 2023-06-19
  Administered 2023-06-19: 4 ug via INTRAVENOUS

## 2023-06-19 MED ORDER — BUPIVACAINE HCL (PF) 0.5 % IJ SOLN
INTRAMUSCULAR | Status: DC | PRN
  Administered 2023-06-19: 10 mL

## 2023-06-19 MED ORDER — VANCOMYCIN HCL IN DEXTROSE 1-5 GM/200ML-% IV SOLN
1000.0000 mg | INTRAVENOUS | Status: AC
  Administered 2023-06-19: 1000 mg via INTRAVENOUS

## 2023-06-19 MED ORDER — OXYCODONE HCL 5 MG PO TABS: 5.0000 mg | ORAL_TABLET | Freq: Once | ORAL | Status: DC | PRN

## 2023-06-19 MED ORDER — LEVOFLOXACIN IN D5W 500 MG/100ML IV SOLN
500.0000 mg | INTRAVENOUS | Status: AC
  Administered 2023-06-19: 500 mg via INTRAVENOUS

## 2023-06-19 MED ORDER — ONDANSETRON HCL 4 MG/2ML IJ SOLN
INTRAMUSCULAR | Status: DC | PRN
  Administered 2023-06-19: 4 mg via INTRAVENOUS

## 2023-06-19 MED ORDER — LACTATED RINGERS IV SOLN: INTRAVENOUS | Status: DC

## 2023-06-19 MED ORDER — METOCLOPRAMIDE HCL 5 MG/ML IJ SOLN: 5.0000 mg | Freq: Three times a day (TID) | INTRAMUSCULAR | Status: DC | PRN

## 2023-06-19 MED ORDER — FENTANYL CITRATE (PF) 100 MCG/2ML IJ SOLN
INTRAMUSCULAR | Status: DC | PRN
  Administered 2023-06-19 (×3): 50 ug via INTRAVENOUS

## 2023-06-19 SURGICAL SUPPLY — 36 items
APL PRP STRL LF DISP 70% ISPRP (MISCELLANEOUS) ×1
BNDG CMPR 5X2 KNTD ELC UNQ LF (GAUZE/BANDAGES/DRESSINGS) ×1
BNDG CMPR 5X4 CHSV STRCH STRL (GAUZE/BANDAGES/DRESSINGS) ×1
BNDG COHESIVE 4X5 TAN STRL LF (GAUZE/BANDAGES/DRESSINGS) ×1 IMPLANT
BNDG ELASTIC 2INX 5YD STR LF (GAUZE/BANDAGES/DRESSINGS) ×1 IMPLANT
BNDG ESMARCH 4 X 12 STRL LF (GAUZE/BANDAGES/DRESSINGS) ×1
BNDG ESMARCH 4X12 STRL LF (GAUZE/BANDAGES/DRESSINGS) ×1 IMPLANT
CHLORAPREP W/TINT 26 (MISCELLANEOUS) ×1 IMPLANT
CORD BIP STRL DISP 12FT (MISCELLANEOUS) ×1 IMPLANT
CUFF TOURN SGL QUICK 18X4 (TOURNIQUET CUFF) ×1 IMPLANT
DRAPE SURG 17X11 SM STRL (DRAPES) ×1 IMPLANT
FORCEPS JEWEL BIP 4-3/4 STR (INSTRUMENTS) ×1 IMPLANT
GAUZE SPONGE 4X4 12PLY STRL (GAUZE/BANDAGES/DRESSINGS) ×1 IMPLANT
GAUZE XEROFORM 1X8 LF (GAUZE/BANDAGES/DRESSINGS) ×1 IMPLANT
GLOVE BIO SURGEON STRL SZ8 (GLOVE) ×1 IMPLANT
GLOVE INDICATOR 8.0 STRL GRN (GLOVE) ×1 IMPLANT
GOWN STRL REUS W/ TWL LRG LVL3 (GOWN DISPOSABLE) ×1 IMPLANT
GOWN STRL REUS W/ TWL XL LVL3 (GOWN DISPOSABLE) ×1 IMPLANT
GOWN STRL REUS W/TWL LRG LVL3 (GOWN DISPOSABLE) ×1
GOWN STRL REUS W/TWL XL LVL3 (GOWN DISPOSABLE) ×1
KIT CARPAL TUNNEL (MISCELLANEOUS) ×1
KIT ESCP INSRT D SLOT CANN KN (MISCELLANEOUS) ×1 IMPLANT
KIT TURNOVER KIT A (KITS) ×1 IMPLANT
MANIFOLD NEPTUNE II (INSTRUMENTS) ×1 IMPLANT
NS IRRIG 500ML POUR BTL (IV SOLUTION) ×1 IMPLANT
PACK EXTREMITY ARMC (MISCELLANEOUS) ×1 IMPLANT
SPLINT WRIST LG LT TX990309 (SOFTGOODS) IMPLANT
SPLINT WRIST LG RT TX900304 (SOFTGOODS) IMPLANT
SPLINT WRIST M LT TX990308 (SOFTGOODS) IMPLANT
SPLINT WRIST M RT TX990303 (SOFTGOODS) IMPLANT
SPLINT WRIST XL LT TX990310 (SOFTGOODS) IMPLANT
SPLINT WRIST XL RT TX990305 (SOFTGOODS) IMPLANT
STOCKINETTE IMPERVIOUS 9X36 MD (GAUZE/BANDAGES/DRESSINGS) ×1 IMPLANT
SUT PROLENE 4 0 PS 2 18 (SUTURE) ×1 IMPLANT
TRAP FLUID SMOKE EVACUATOR (MISCELLANEOUS) ×1 IMPLANT
WATER STERILE IRR 500ML POUR (IV SOLUTION) ×1 IMPLANT

## 2023-06-19 NOTE — Op Note (Signed)
06/19/2023  2:10 PM  Patient:   Laurie Hubbard  Pre-Op Diagnosis:   Left carpal tunnel syndrome.  Post-Op Diagnosis:   Same.  Procedure:   Endoscopic left carpal tunnel release.  Surgeon:   Maryagnes Amos, MD  Anesthesia:   General LMA  Findings:   As above.  Complications:   None  EBL:   0 cc  Fluids:   400 cc crystalloid  TT:   13 minutes at 250 mmHg  Drains:   None  Closure:   4-0 Prolene interrupted sutures  Brief Clinical Note:   The patient is a 57 year old female with a long history of gradually worsening pain and paresthesias to her left hand. Her symptoms have progressed despite medications, activity modification, etc. Her history and examination are consistent with carpal tunnel syndrome. The patient presents at this time for an endoscopic left carpal tunnel release.   Procedure:   The patient was brought into the operating room and lain in the supine position. After adequate general laryngeal mask anesthesia was obtained, the left hand and upper extremity were prepped with ChloraPrep solution before being draped sterilely. Preoperative antibiotics were administered. A timeout was performed to verify the appropriate surgical site before the limb was exsanguinated with an Esmarch and the tourniquet inflated to 250 mmHg.   An approximately 1.5-2 cm incision was made over the volar wrist flexion crease, centered over the palmaris longus tendon. The incision was carried down through the subcutaneous tissues with care taken to identify and protect any neurovascular structures. The distal forearm fascia was penetrated just proximal to the transverse carpal ligament. The soft tissues were released off the superficial and deep surfaces of the distal forearm fascia and this was released proximally for 3-4 cm under direct visualization.  Attention was directed distally. The Therapist, nutritional was passed beneath the transverse carpal ligament along the ulnar aspect of the  carpal tunnel and used to release any adhesions as well as to remove any adherent synovial tissue before first the smaller then the larger of the two dilators were passed beneath the transverse carpal ligament along the ulnar margin of the carpal tunnel. The slotted cannula was introduced and the endoscope was placed into the slotted cannula and the undersurface of the transverse carpal ligament visualized. The distal margin of the transverse carpal ligament was marked by placing a 25-gauge needle percutaneously at Kaplan's cardinal point so that it entered the distal portion of the slotted cannula. Under endoscopic visualization, the transverse carpal ligament was released from proximal to distal using the end-cutting blade. A second pass was performed to ensure complete release of the ligament. The adequacy of release was verified both endoscopically and by palpation using the freer elevator.  The wound was irrigated thoroughly with sterile saline solution before being closed using 4-0 Prolene interrupted sutures. A total of 10 cc of 0.5% plain Sensorcaine was injected in and around the incision before a sterile bulky dressing was applied to the wound. The patient was placed into a volar wrist splint before being awakened, extubated, and returned to the recovery room in satisfactory condition after tolerating the procedure well.

## 2023-06-19 NOTE — Transfer of Care (Signed)
Immediate Anesthesia Transfer of Care Note  Patient: Laurie Hubbard  Procedure(s) Performed: CARPAL TUNNEL RELEASE ENDOSCOPIC (Left: Wrist)  Patient Location: PACU  Anesthesia Type:General  Level of Consciousness: awake, drowsy, and patient cooperative  Airway & Oxygen Therapy: Patient Spontanous Breathing  Post-op Assessment: Report given to RN, Post -op Vital signs reviewed and stable, and Patient moving all extremities  Post vital signs: Reviewed and stable  Last Vitals:  Vitals Value Taken Time  BP 133/100 06/19/23 1415  Temp    Pulse 90 06/19/23 1418  Resp 11 06/19/23 1418  SpO2 99 % 06/19/23 1418  Vitals shown include unfiled device data.  Last Pain:  Vitals:   06/19/23 1048  PainSc: 0-No pain         Complications: No notable events documented.

## 2023-06-19 NOTE — Discharge Instructions (Addendum)
Orthopedic discharge instructions: Keep dressing dry and intact. Keep hand elevated above heart level. May shower after dressing removed on postop day 4 (Monday). Cover sutures with Band-Aids after drying off, then reapply Velcro splint. Apply ice to affected area frequently. Take ibuprofen 600-800 mg TID with meals for 3-5 days, then as necessary. Take ES Tylenol between doses of ibuprofen if/when needed.  Return for follow-up in 10-14 days or as scheduled.  AMBULATORY SURGERY  DISCHARGE INSTRUCTIONS   The drugs that you were given will stay in your system until tomorrow so for the next 24 hours you should not:  Drive an automobile Make any legal decisions Drink any alcoholic beverage   You may resume regular meals tomorrow.  Today it is better to start with liquids and gradually work up to solid foods.  You may eat anything you prefer, but it is better to start with liquids, then soup and crackers, and gradually work up to solid foods.   Please notify your doctor immediately if you have any unusual bleeding, trouble breathing, redness and pain at the surgery site, drainage, fever, or pain not relieved by medication.    Additional Instructions:    Please contact your physician with any problems or Same Day Surgery at [phone], Monday through Friday 6 am to 4 pm, or [facility] number at [phone].

## 2023-06-19 NOTE — Anesthesia Preprocedure Evaluation (Signed)
Anesthesia Evaluation  Patient identified by MRN, date of birth, ID band Patient awake  General Assessment Comment:Success with Glidescope  Reviewed: Allergy & Precautions, H&P , NPO status , Patient's Chart, lab work & pertinent test results  History of Anesthesia Complications (+) DIFFICULT AIRWAY and history of anesthetic complications  Airway Mallampati: IV  TM Distance: >3 FB Neck ROM: full    Dental no notable dental hx. (+) Chipped, Dental Advidsory Given   Pulmonary neg pulmonary ROS, former smoker   Pulmonary exam normal breath sounds clear to auscultation       Cardiovascular hypertension, negative cardio ROS Normal cardiovascular exam Rhythm:regular Rate:Normal     Neuro/Psych negative neurological ROS  negative psych ROS   GI/Hepatic negative GI ROS, Neg liver ROS,GERD  ,,  Endo/Other  negative endocrine ROS  Morbid obesity  Renal/GU      Musculoskeletal   Abdominal   Peds  Hematology negative hematology ROS (+)   Anesthesia Other Findings Past Medical History: No date: Anxiety No date: Arthritis     Comment:  knees and left thumb No date: Calculus of gallbladder with other cholecystitis, without  mention of obstruction No date: Colon polyps No date: Complication of anesthesia     Comment:  Diff intubation - 05/01/11 - [facility] -  see report in paper               chart 05/01/2011: Difficult intubation     Comment:  during cholecystectomy No date: Hypercalcemia No date: Hyperlipidemia, mixed No date: Hyperparathyroidism (HCC) No date: Morbid obesity with BMI of 45.0-49.9, adult (HCC) No date: Pinched nerve     Comment:  lower back No date: Pre-diabetes No date: Primary hypertension 05/2023: Primary osteoarthritis of left knee No date: Seasonal allergies No date: Vitamin D deficiency  Past Surgical History: 08/26/1997: ABDOMINAL HYSTERECTOMY 06/10/2011: BREAST BIOPSY; Right 2023: BREAST  BIOPSY; Right 10/04/2016: BREAST BIOPSY; Right 05/01/2011: CHOLECYSTECTOMY     Comment:  diff intubation - see records 06/10/2012: COLONOSCOPY     Comment:  Dr Mechele Collin 04/10/2009: COLONOSCOPY 06/22/2015: COLONOSCOPY 07/16/2018: COLONOSCOPY No date: DILATION AND CURETTAGE OF UTERUS 02/10/2015: KNEE ARTHROSCOPY; Left     Comment:  Procedure: ARTHROSCOPY KNEE;  Surgeon: Erin Sons,               MD;  Location[facility];  Service:               Orthopedics;  Laterality: Left; 05/10/2021: PARATHYROIDECTOMY; Left     Comment:  Procedure: PARATHYROIDECTOMY;  Surgeon: Vernie Murders,               MD;  Location: [facility];  Service: ENT;                Laterality: Left;  leave first case 08/26/1969: TONSILLECTOMY AND ADENOIDECTOMY No date: TUMOR EXCISION; Right     Comment:  Hip - benign  BMI    Body Mass Index: 46.10 kg/m      Reproductive/Obstetrics negative OB ROS                             Anesthesia Physical Anesthesia Plan  ASA: 2  Anesthesia Plan: General   Post-op Pain Management:    Induction: Intravenous  PONV Risk Score and Plan: 3 and Ondansetron, Dexamethasone and Midazolam  Airway Management Planned: LMA  Additional Equipment:   Intra-op Plan:   Post-operative Plan: Extubation in OR  Informed Consent: I  have reviewed the patients History and Physical, chart, labs and discussed the procedure including the risks, benefits and alternatives for the proposed anesthesia with the patient or authorized representative who has indicated his/her understanding and acceptance.     Dental Advisory Given  Plan Discussed with: Anesthesiologist, CRNA and Surgeon  Anesthesia Plan Comments: (Patient consented for risks of anesthesia including but not limited to:  - adverse reactions to medications - damage to eyes, teeth, lips or other oral mucosa - nerve damage due to positioning  - sore throat or hoarseness -  Damage to heart, brain, nerves, lungs, other parts of body or loss of life  Patient voiced understanding and assent.)       Anesthesia Quick Evaluation

## 2023-06-19 NOTE — Anesthesia Procedure Notes (Signed)
Procedure Name: LMA Insertion Date/Time: 06/19/2023 1:30 PM  Performed by: Henrietta Hoover, CRNAPre-anesthesia Checklist: Emergency Drugs available, Patient identified, Suction available and Patient being monitored Patient Re-evaluated:Patient Re-evaluated prior to induction Oxygen Delivery Method: Circle system utilized Preoxygenation: Pre-oxygenation with 100% oxygen Induction Type: IV induction LMA: LMA inserted LMA Size: 4.0 Number of attempts: 1 Placement Confirmation: positive ETCO2 and breath sounds checked- equal and bilateral Tube secured with: Tape Dental Injury: Teeth and Oropharynx as per pre-operative assessment

## 2023-06-19 NOTE — Anesthesia Postprocedure Evaluation (Signed)
Anesthesia Post Note  Patient: Laurie Hubbard  Procedure(s) Performed: CARPAL TUNNEL RELEASE ENDOSCOPIC (Left: Wrist)  Patient location during evaluation: PACU Anesthesia Type: General Level of consciousness: awake and alert Pain management: pain level controlled Vital Signs Assessment: post-procedure vital signs reviewed and stable Respiratory status: spontaneous breathing, nonlabored ventilation, respiratory function stable and patient connected to nasal cannula oxygen Cardiovascular status: blood pressure returned to baseline and stable Postop Assessment: no apparent nausea or vomiting Anesthetic complications: no  No notable events documented.   Last Vitals:  Vitals:   06/19/23 1414 06/19/23 1415  BP: (!) 147/86 (!) 133/100  Pulse:  92  Resp:  10  Temp:    SpO2:  95%    Last Pain:  Vitals:   06/19/23 1048  PainSc: 0-No pain                 Stephanie Coup

## 2023-06-19 NOTE — H&P (Signed)
History of Present Illness:  Laurie Hubbard is a 57 y.o. female who presents for evaluation and treatment of her left hand pain and paresthesias. She notes that the numbness and paresthesias affect her thumb, index, long, and ring fingers, but though not affect her little finger. Her symptoms are worse at night, frequently awakening her from sleep, but she also has pain during the course of the day, especially with more repetitive or sustained activities such as driving, holding a book or phone, etc. She denies any specific injury to the hand or wrist, either recently or remotely. She has been taking Neurontin, as well as meloxicam or ibuprofen as necessary with limited benefit. She also has been wearing a off-the-shelf thumb splint, especially at night, as necessary with limited benefit.  Current Outpatient Medications:  CETIRIZINE HCL (ZYRTEC ORAL) Take 1 capsule by mouth once daily  cholecalciferol 1000 unit tablet Take by mouth  EPIPEN 2-PAK 0.3 mg/0.3 mL pen injector Inject 0.3 mg into the muscle as needed 0  ergocalciferol, vitamin D2, 1,250 mcg (50,000 unit) capsule take one capsule by mouth every week 8 capsule 1  gabapentin (NEURONTIN) 300 MG capsule Take 1 capsule (300 mg total) by mouth at bedtime 30 capsule 5  ibuprofen (MOTRIN) 800 MG tablet TAKE ONE TABLET BY MOUTH EVERY 8 HOURS AS NEEDED FOR PAIN 60 tablet 3  lidocaine (LIDODERM) 5 % patch Place 1 patch onto the skin once daily as needed  lisinopriL-hydroCHLOROthiazide (ZESTORETIC) 20-25 mg tablet Take 1 tablet by mouth once daily 90 tablet 3  meloxicam (MOBIC) 15 MG tablet Take 1 tablet (15 mg total) by mouth once daily 90 tablet 1  multivitamin capsule Take 1 capsule by mouth once daily.  omeprazole (PRILOSEC) 40 MG DR capsule Take 40 mg by mouth as needed  PARoxetine (PAXIL) 10 MG tablet TAKE ONE TABLET BY MOUTH ONE TIME DAILY 90 tablet 3  phentermine (ADIPEX-P) 37.5 mg tablet TAKE ONE TABLET BY MOUTH EVERY MORNING BEFORE BREAKFAST  90 tablet 0  propranoloL (INDERAL) 20 MG tablet TAKE ONE TABLET BY MOUTH TWICE A DAY 180 tablet 3  simvastatin (ZOCOR) 10 MG tablet Take 1 tablet (10 mg total) by mouth at bedtime 90 tablet 2  tirzepatide (ZEPBOUND) 2.5 mg/0.5 mL pen injector Inject 0.5 mLs (2.5 mg total) subcutaneously every 7 (seven) days 2 mL 2   Allergies:  Cinnamon Swelling by test  Garlic Swelling by test  Keflex [Cephalexin] Angioedema in lower lip   Past Medical History:  Anxiety  Arthritis  Complication of anesthesia  Environmental allergies  GERD (gastroesophageal reflux disease)  pt states she has never had this issue  Hyperlipidemia  Hypertension  Obesity  Osteoarthritis  Parathyroid related hypercalcemia (CMS/HHS-HCC) - primary hyperparathyroidism, diagnosis 12/2018   Past Surgical History:  TONSILLECTOMY AND ADENOIDECTOMY 1971  HYSTERECTOMY 1999 (for heavy bleeding, ovaries intact)  COLONOSCOPY 04/10/2009 (Dr. Maggie Font @ [facility] - Adenomatous Polyp, FHPolyps) CHOLECYSTECTOMY 05/01/2011  Chronic cholecystitis and cholelithiasis.  Hx of breast biopsy Right 06/10/2011 (Core biopsy of a right breast nodule: Nodular fibrocystic changes with rare microcalcification)  COLONOSCOPY 06/10/2012 (Dr. Maggie Font @ [facility] - 14mm Adenomatous Polyp, FHPolyps: CBF 05/2015)  KNEE ARTHROSCOPY Left 02/10/2015 (Partial medial meniscectomy and chondral debridement, Dr. Gavin Potters)  COLONOSCOPY 06/22/2015 (Dr. Maggie Font @ [facility] - Adenomatous Polyps, FHPolyps: CBF 05/2018)  PERCUTANEOUS BIOPSY BREAST Right 10/04/2016 (Pseudoangiomatous stromal hyperplasia)  COLONOSCOPY 07/16/2018 (Dr. Elvera Lennox. Ellliott @ [facility] - PHPolyp, FHPolyps: CBF 06/2023)  PARATHYROIDECTOMY Left 05/10/2021  BREAST BIOPSY 2023  DILATION AND CURETTAGE, DIAGNOSTIC / THERAPEUTIC  Removal of hip tumor Right (benign)   Family History:  Colon polyps Mother LINDA  High blood pressure (Hypertension) Mother LINDA  Ovarian cancer Mother LINDA  Thyroid disease Mother  LINDA  Uterine cancer Mother LINDA  Colon polyps Father BOBBY  High blood pressure (Hypertension) Father BOBBY  Diabetes Sister TABITHA  Colon polyps Maternal Aunt  Colon polyps Paternal Aunt  Colon polyps Paternal Aunt  Colon cancer Paternal Darrol Poke  Ovarian cancer Maternal Grandmother MINNIE  Colon cancer Maternal Grandfather ROBERT   Social History:   Socioeconomic History:  Marital status: Married  Spouse name: Jimmy  Number of children: 2  Years of education: 12  Occupational History  Occupation: Full time- [employer] service  Tobacco Use  Smoking status: Former  Current packs/day: 0.00  Types: Cigarettes  Quit date: 04/06/1983  Years since quitting: 40.1  Smokeless tobacco: Never  Vaping Use  Vaping status: Never Used  Substance and Sexual Activity  Alcohol use: Not Currently  Comment: occasional glass of wine  Drug use: No  Sexual activity: Not Currently  Partners: Male  Birth control/protection: Surgical  Other Topics Concern  Would you please tell us about the people who live in your home, your pets, or anything else important to your social life? Yes   Social Determinants of Health:   Financial Resource Strain: Low Risk (05/18/2023)  Overall Financial Resource Strain (CARDIA)  Difficulty of Paying Living Expenses: Not hard at all  Food Insecurity: No Food Insecurity (05/18/2023)  Hunger Vital Sign  Worried About Running Out of Food in the Last Year: Never true  Ran Out of Food in the Last Year: Never true  Transportation Needs: No Transportation Needs (05/18/2023)  PRAPARE - Risk analyst (Medical): No  Lack of Transportation (Non-Medical): No   Review of Systems:  A comprehensive 14 point ROS was performed, reviewed, and the pertinent orthopaedic findings are documented in the HPI.  Physical Exam: Vitals:  05/21/23 1001  BP: 136/82  Weight: (!) 130.5 kg (287 lb 9.6 oz)  Height: 158.8 cm (5' 2.5")  PainSc: 8  PainLoc:  Hand   General/Constitutional: Pleasant significantly overweight middle-age female in no acute distress. Neuro/Psych: Normal mood and affect, oriented to person, place and time. Eyes: Non-icteric. Pupils are equal, round, and reactive to light, and exhibit synchronous movement. ENT: Unremarkable. Lymphatic: No palpable adenopathy. Respiratory: Lungs clear to auscultation, Normal chest excursion, No wheezes, and Non-labored breathing Cardiovascular: Regular rate and rhythm. No murmurs. and No edema, swelling or tenderness, except as noted in detailed exam. Integumentary: No impressive skin lesions present, except as noted in detailed exam. Musculoskeletal: Unremarkable, except as noted in detailed exam.  Left wrist/hand exam: Skin inspection of the left wrist and hand is unremarkable. No swelling, erythema, ecchymosis, abrasions, or other skin abnormalities are identified. There is no tenderness to palpation over the dorsal or volar aspects of her wrist, nor does she have any tenderness to palpation over the dorsal or palmar aspects of her hand. She exhibits full active and passive range of motion of her wrist without any pain or catching. She is able to actively flex and extend all digits fully without any pain or triggering. She is grossly neurovascularly intact to all digits. She has an equivocally positive Phalen's test, but a negative Tinel's over the carpal tunnel.  Assessment: 1. Carpal tunnel syndrome, left.   Plan: The treatment options were discussed with the patient. In addition, patient educational  materials were provided regarding the diagnosis and treatment options. The patient is quite frustrated by her symptoms and function limitations and is ready to consider more aggressive treatment options. Therefore, I have recommended a surgical procedure, specifically an endoscopic left carpal tunnel release. The procedure was discussed with the patient, as were the potential risks (including  bleeding, infection, nerve and/or blood vessel injury, persistent or recurrent pain/paresthesias, weakness of grip, need for further surgery, blood clots, strokes, heart attacks and/or arhythmias, pneumonia, etc.) and benefits. The patient states her understanding and wishes to proceed. All of the patient's questions and concerns were answered. She can call any time with further concerns. She will follow up post-surgery, routine. The patient has been fitted with a carpal tunnel splint to wear at night and as necessary prior to her surgery. The splint also can be used after her surgery.    H&P reviewed and patient re-examined. No changes.

## 2023-06-20 ENCOUNTER — Encounter: Payer: Self-pay | Admitting: Surgery

## 2023-06-30 DIAGNOSIS — Z01818 Encounter for other preprocedural examination: Secondary | ICD-10-CM | POA: Diagnosis not present

## 2023-06-30 DIAGNOSIS — Z860100 Personal history of colon polyps, unspecified: Secondary | ICD-10-CM | POA: Diagnosis not present

## 2023-07-03 DIAGNOSIS — J301 Allergic rhinitis due to pollen: Secondary | ICD-10-CM | POA: Diagnosis not present

## 2023-07-08 DIAGNOSIS — H9202 Otalgia, left ear: Secondary | ICD-10-CM | POA: Diagnosis not present

## 2023-07-08 DIAGNOSIS — H6123 Impacted cerumen, bilateral: Secondary | ICD-10-CM | POA: Diagnosis not present

## 2023-07-14 DIAGNOSIS — K080 Exfoliation of teeth due to systemic causes: Secondary | ICD-10-CM | POA: Diagnosis not present

## 2023-07-17 DIAGNOSIS — J301 Allergic rhinitis due to pollen: Secondary | ICD-10-CM | POA: Diagnosis not present

## 2023-07-17 DIAGNOSIS — K080 Exfoliation of teeth due to systemic causes: Secondary | ICD-10-CM | POA: Diagnosis not present

## 2023-08-07 DIAGNOSIS — J301 Allergic rhinitis due to pollen: Secondary | ICD-10-CM | POA: Diagnosis not present

## 2023-09-02 DIAGNOSIS — K080 Exfoliation of teeth due to systemic causes: Secondary | ICD-10-CM | POA: Diagnosis not present

## 2023-09-04 DIAGNOSIS — J301 Allergic rhinitis due to pollen: Secondary | ICD-10-CM | POA: Diagnosis not present

## 2023-09-17 DIAGNOSIS — J301 Allergic rhinitis due to pollen: Secondary | ICD-10-CM | POA: Diagnosis not present

## 2023-09-25 DIAGNOSIS — J301 Allergic rhinitis due to pollen: Secondary | ICD-10-CM | POA: Diagnosis not present

## 2023-10-13 DIAGNOSIS — E782 Mixed hyperlipidemia: Secondary | ICD-10-CM | POA: Diagnosis not present

## 2023-10-13 DIAGNOSIS — R7303 Prediabetes: Secondary | ICD-10-CM | POA: Diagnosis not present

## 2023-10-13 DIAGNOSIS — Z79899 Other long term (current) drug therapy: Secondary | ICD-10-CM | POA: Diagnosis not present

## 2023-10-13 DIAGNOSIS — I10 Essential (primary) hypertension: Secondary | ICD-10-CM | POA: Diagnosis not present

## 2023-10-20 DIAGNOSIS — E785 Hyperlipidemia, unspecified: Secondary | ICD-10-CM | POA: Diagnosis not present

## 2023-10-20 DIAGNOSIS — R7303 Prediabetes: Secondary | ICD-10-CM | POA: Diagnosis not present

## 2023-10-20 DIAGNOSIS — Z0000 Encounter for general adult medical examination without abnormal findings: Secondary | ICD-10-CM | POA: Diagnosis not present

## 2023-10-20 DIAGNOSIS — I10 Essential (primary) hypertension: Secondary | ICD-10-CM | POA: Diagnosis not present

## 2023-10-23 DIAGNOSIS — J301 Allergic rhinitis due to pollen: Secondary | ICD-10-CM | POA: Diagnosis not present

## 2023-10-29 DIAGNOSIS — K080 Exfoliation of teeth due to systemic causes: Secondary | ICD-10-CM | POA: Diagnosis not present

## 2023-11-04 ENCOUNTER — Encounter: Payer: Self-pay | Admitting: *Deleted

## 2023-11-10 DIAGNOSIS — Z8639 Personal history of other endocrine, nutritional and metabolic disease: Secondary | ICD-10-CM | POA: Diagnosis not present

## 2023-11-10 DIAGNOSIS — J301 Allergic rhinitis due to pollen: Secondary | ICD-10-CM | POA: Diagnosis not present

## 2023-11-10 DIAGNOSIS — H6123 Impacted cerumen, bilateral: Secondary | ICD-10-CM | POA: Diagnosis not present

## 2023-11-11 ENCOUNTER — Encounter
Admission: RE | Disposition: A | Discharge status: Home / Self Care | Payer: Self-pay | Source: Home / Self Care | Attending: Gastroenterology

## 2023-11-11 ENCOUNTER — Ambulatory Visit

## 2023-11-11 ENCOUNTER — Encounter: Payer: Self-pay | Admitting: *Deleted

## 2023-11-11 ENCOUNTER — Ambulatory Visit
Admission: RE | Admit: 2023-11-11 | Discharge: 2023-11-11 | Disposition: A | Discharge status: Home / Self Care | Payer: Medicare Other | Attending: Gastroenterology | Admitting: Gastroenterology

## 2023-11-11 DIAGNOSIS — K5730 Diverticulosis of large intestine without perforation or abscess without bleeding: Secondary | ICD-10-CM | POA: Diagnosis not present

## 2023-11-11 DIAGNOSIS — K219 Gastro-esophageal reflux disease without esophagitis: Secondary | ICD-10-CM | POA: Insufficient documentation

## 2023-11-11 DIAGNOSIS — Z860100 Personal history of colon polyps, unspecified: Secondary | ICD-10-CM | POA: Diagnosis not present

## 2023-11-11 DIAGNOSIS — Z1211 Encounter for screening for malignant neoplasm of colon: Secondary | ICD-10-CM | POA: Insufficient documentation

## 2023-11-11 DIAGNOSIS — Z09 Encounter for follow-up examination after completed treatment for conditions other than malignant neoplasm: Secondary | ICD-10-CM | POA: Diagnosis not present

## 2023-11-11 DIAGNOSIS — Z6841 Body Mass Index (BMI) 40.0 and over, adult: Secondary | ICD-10-CM | POA: Insufficient documentation

## 2023-11-11 DIAGNOSIS — Z87891 Personal history of nicotine dependence: Secondary | ICD-10-CM | POA: Diagnosis not present

## 2023-11-11 DIAGNOSIS — I10 Essential (primary) hypertension: Secondary | ICD-10-CM | POA: Diagnosis not present

## 2023-11-11 DIAGNOSIS — E66813 Obesity, class 3: Secondary | ICD-10-CM | POA: Insufficient documentation

## 2023-11-11 HISTORY — PX: COLONOSCOPY WITH PROPOFOL: SHX5780

## 2023-11-11 SURGERY — COLONOSCOPY WITH PROPOFOL
Anesthesia: General

## 2023-11-11 MED ORDER — SODIUM CHLORIDE 0.9 % IV SOLN: INTRAVENOUS | Status: DC

## 2023-11-11 MED ORDER — STERILE WATER FOR IRRIGATION IR SOLN
Status: DC | PRN
  Administered 2023-11-11: 60 mL

## 2023-11-11 MED ORDER — PROPOFOL 500 MG/50ML IV EMUL
INTRAVENOUS | Status: DC | PRN
  Administered 2023-11-11 (×2): 50 mg via INTRAVENOUS
  Administered 2023-11-11: 100 mg via INTRAVENOUS
  Administered 2023-11-11: 50 mg via INTRAVENOUS
  Administered 2023-11-11: 200 ug/kg/min via INTRAVENOUS

## 2023-11-11 NOTE — Anesthesia Postprocedure Evaluation (Signed)
 Anesthesia Post Note  Patient: Laurie Hubbard  Procedure(s) Performed: COLONOSCOPY WITH PROPOFOL  Patient location during evaluation: Endoscopy Anesthesia Type: General Level of consciousness: awake and alert Pain management: pain level controlled Vital Signs Assessment: post-procedure vital signs reviewed and stable Respiratory status: spontaneous breathing, nonlabored ventilation, respiratory function stable and patient connected to nasal cannula oxygen Cardiovascular status: blood pressure returned to baseline and stable Postop Assessment: no apparent nausea or vomiting Anesthetic complications: no   There were no known notable events for this encounter.   Last Vitals:  Vitals:   11/11/23 1209 11/11/23 1219  BP: 106/73 122/61  Pulse: 95 92  Resp: 18 19  Temp: (!) 36.1 C   SpO2: 94% 98%    Last Pain:  Vitals:   11/11/23 1219  TempSrc:   PainSc: 0-No pain                 Cleda Mccreedy Alizabeth Antonio

## 2023-11-11 NOTE — H&P (Signed)
 Outpatient short stay form Pre-procedure 11/11/2023  Regis Bill, MD  Primary Physician: Marguarite Arbour, MD  Reason for visit:  Surveillance  History of present illness:    58 y/o lady with morbid obesity, hypertension,  and history of polyps here for surveillance colonoscopy. Last colonoscopy was about 5 years ago and was reportedly normal. No blood thinners. History of cholecystectomy. No first degree relatives with GI malignancies.    Current Facility-Administered Medications:    0.9 %  sodium chloride infusion, , Intravenous, Continuous, Montina Dorrance, Rossie Muskrat, MD, Last Rate: 20 mL/hr at 11/11/23 1133, Continued from Pre-op at 11/11/23 1133  Medications Prior to Admission  Medication Sig Dispense Refill Last Dose/Taking   lisinopril-hydrochlorothiazide (PRINZIDE,ZESTORETIC) 20-25 MG per tablet Take 1 tablet by mouth daily. AM   11/10/2023   propranolol (INDERAL) 20 MG tablet Take 20 mg by mouth 2 (two) times daily.   11/11/2023   cetirizine (ZYRTEC) 10 MG tablet Take 10 mg by mouth daily. AM      Cholecalciferol (EQL VITAMIN D3) 50 MCG (2000 UT) CAPS Take 1 capsule by mouth daily.      EPINEPHrine 0.3 mg/0.3 mL IJ SOAJ injection Inject 0.3 mg into the muscle as needed for anaphylaxis.      ergocalciferol (VITAMIN D2) 1.25 MG (50000 UT) capsule Take 50,000 Units by mouth once a week. Thurs      gabapentin (NEURONTIN) 300 MG capsule Take 300 mg by mouth at bedtime.      ibuprofen (ADVIL) 800 MG tablet Take 800 mg by mouth daily as needed.      lidocaine (LIDODERM) 5 % Place 1 patch onto the skin daily as needed. Remove & Discard patch within 12 hours or as directed by MD      Multiple Vitamin (MULTIVITAMIN WITH MINERALS) TABS tablet Take 1 tablet by mouth daily.      omeprazole (PRILOSEC) 40 MG capsule Take 40 mg by mouth daily as needed.      PARoxetine (PAXIL) 10 MG tablet Take 1 tablet by mouth every other day. AM      Phenazopyrid-Cranbry-C-Probiot (AZO URINARY TRACT SUPPORT  PO) Take 2 tablets by mouth daily.      phentermine 37.5 MG capsule Take 37.5 mg by mouth every morning.      QUNOL COQ10/UBIQUINOL/MEGA PO Take 1 tablet by mouth daily.      simvastatin (ZOCOR) 10 MG tablet Take 1 tablet by mouth at bedtime.        Allergies  Allergen Reactions   Cinnamon Other (See Comments)    By test   Garlic Other (See Comments)    By test   Andres Labrum [Cephalexin] Itching    Lips swell     Past Medical History:  Diagnosis Date   Anxiety    Arthritis    knees and left thumb   Calculus of gallbladder with other cholecystitis, without mention of obstruction    Colon polyps    Complication of anesthesia    Diff intubation - 05/01/11 - [facility] -  see report in paper chart   Difficult intubation 05/01/2011   during cholecystectomy   Hypercalcemia    Hyperlipidemia, mixed    Hyperparathyroidism (HCC)    Morbid obesity with BMI of 45.0-49.9, adult (HCC)    Pinched nerve    lower back   Pre-diabetes    Primary hypertension    Primary osteoarthritis of left knee 05/2023   Seasonal allergies    Vitamin D deficiency     Review  of systems:  Otherwise negative.    Physical Exam  Gen: Alert, oriented. Appears stated age.  HEENT: PERRLA. Lungs: No respiratory distress CV: RRR Abd: soft, benign, no masses Ext: No edema    Planned procedures: Proceed with colonoscopy. The patient understands the nature of the planned procedure, indications, risks, alternatives and potential complications including but not limited to bleeding, infection, perforation, damage to internal organs and possible oversedation/side effects from anesthesia. The patient agrees and gives consent to proceed.  Please refer to procedure notes for findings, recommendations and patient disposition/instructions.     Regis Bill, MD [facility]

## 2023-11-11 NOTE — Op Note (Signed)
 [facility]astro[facility] Patient Name: Laurie Hubbard Procedure Date: 11/11/2023 11:31 AM MRN: [id number] Account #: [id number] Date of Birth: 05/29/1966 Admit Type: Outpatient Age: 58 Room: [facility] ENDO ROOM 1 Gender: Female Note Status: Finalized Instrument Name: Prentice Docker [id number] Procedure:             Colonoscopy Indications:           High risk colon cancer surveillance: Personal history                         of colonic polyps, Last colonoscopy 5 years ago Providers:             Eather Colas MD, MD Referring MD:          Eather Colas MD, MD (Referring MD), Duane Lope.                         Judithann Sheen, MD (Referring MD) Medicines:             Monitored Anesthesia Care Complications:         No immediate complications. Procedure:             Pre-Anesthesia Assessment:                        - Prior to the procedure, a History and Physical was                         performed, and patient medications and allergies were                         reviewed. The patient is competent. The risks and                         benefits of the procedure and the sedation options and                         risks were discussed with the patient. All questions                         were answered and informed consent was obtained.                         Patient identification and proposed procedure were                         verified by the physician, the nurse, the                         anesthesiologist, the anesthetist and the technician                         in the endoscopy suite. Mental Status Examination:                         alert and oriented. Airway Examination: normal                         oropharyngeal airway and neck mobility. Respiratory  Examination: clear to auscultation. CV Examination:                         normal. Prophylactic Antibiotics: The patient does not                         require prophylactic  antibiotics. Prior                         Anticoagulants: The patient has taken no anticoagulant                         or antiplatelet agents. ASA Grade Assessment: III - A                         patient with severe systemic disease. After reviewing                         the risks and benefits, the patient was deemed in                         satisfactory condition to undergo the procedure. The                         anesthesia plan was to use monitored anesthesia care                         (MAC). Immediately prior to administration of                         medications, the patient was re-assessed for adequacy                         to receive sedatives. The heart rate, respiratory                         rate, oxygen saturations, blood pressure, adequacy of                         pulmonary ventilation, and response to care were                         monitored throughout the procedure. The physical                         status of the patient was re-assessed after the                         procedure.                        After obtaining informed consent, the colonoscope was                         passed under direct vision. Throughout the procedure,                         the patient's blood pressure, pulse, and oxygen  saturations were monitored continuously. The                         Colonoscope was introduced through the anus and                         advanced to the the cecum, identified by appendiceal                         orifice and ileocecal valve. The colonoscopy was                         performed without difficulty. The patient tolerated                         the procedure well. The quality of the bowel                         preparation was fair. The ileocecal valve, appendiceal                         orifice, and rectum were photographed. Findings:      The perianal and digital rectal examinations were normal.       Multiple small-mouthed diverticula were found in the sigmoid colon and       descending colon.      Retroflexion in the rectum was not performed due to the colonoscope       up/down wheel was inadequate. No abnormalities found on slow withdrawal. Impression:            - Preparation of the colon was fair.                        - Diverticulosis in the sigmoid colon and in the                         descending colon.                        - No specimens collected. Recommendation:        - Discharge patient to home.                        - Resume previous diet.                        - Continue present medications.                        - Repeat colonoscopy in 3 years because the bowel                         preparation was suboptimal.                        - Return to referring physician as previously                         scheduled. Procedure Code(s):     --- Professional ---  G0105, Colorectal cancer screening; colonoscopy on                         individual at high risk Diagnosis Code(s):     --- Professional ---                        Z86.010, Personal history of colonic polyps                        K57.30, Diverticulosis of large intestine without                         perforation or abscess without bleeding CPT copyright 2022 American [employer] Association. All rights reserved. The codes documented in this report are preliminary and upon coder review may  be revised to meet current compliance requirements. Eather Colas MD, MD 11/11/2023 12:21:08 PM Number of Addenda: 0 Note Initiated On: 11/11/2023 11:31 AM Scope Withdrawal Time: 0 hours 5 minutes 30 seconds  Total Procedure Duration: 0 hours 9 minutes 23 seconds  Estimated Blood Loss:  Estimated blood loss: none.      [facility]

## 2023-11-11 NOTE — Transfer of Care (Signed)
 Immediate Anesthesia Transfer of Care Note  Patient: Laurie Hubbard  Procedure(s) Performed: COLONOSCOPY WITH PROPOFOL  Patient Location: PACU  Anesthesia Type:General  Level of Consciousness: awake  Airway & Oxygen Therapy: Patient Spontanous Breathing  Post-op Assessment: Report given to RN and Post -op Vital signs reviewed and stable  Post vital signs: Reviewed and stable  Last Vitals:  Vitals Value Taken Time  BP    Temp    Pulse 95 11/11/23 1209  Resp 17 11/11/23 1209  SpO2 94 % 11/11/23 1209  Vitals shown include unfiled device data.  Last Pain:  Vitals:   11/11/23 1041  TempSrc: Temporal  PainSc: 0-No pain         Complications: There were no known notable events for this encounter.

## 2023-11-11 NOTE — Interval H&P Note (Signed)
 History and Physical Interval Note:  11/11/2023 11:44 AM  Laurie Hubbard  has presented today for surgery, with the diagnosis of Z86.0100 (ICD-10-CM) - History of colon polyps.  The various methods of treatment have been discussed with the patient and family. After consideration of risks, benefits and other options for treatment, the patient has consented to  Procedure(s): COLONOSCOPY WITH PROPOFOL (N/A) as a surgical intervention.  The patient's history has been reviewed, patient examined, no change in status, stable for surgery.  I have reviewed the patient's chart and labs.  Questions were answered to the patient's satisfaction.     Regis Bill  Ok to proceed with colonoscopy

## 2023-11-11 NOTE — Anesthesia Preprocedure Evaluation (Signed)
 Anesthesia Evaluation  Patient identified by MRN, date of birth, ID band Patient awake    Reviewed: Allergy & Precautions, NPO status , Patient's Chart, lab work & pertinent test results  History of Anesthesia Complications (+) DIFFICULT AIRWAY and history of anesthetic complications  Airway Mallampati: III  TM Distance: <3 FB Neck ROM: full    Dental  (+) Chipped   Pulmonary neg pulmonary ROS, neg shortness of breath, former smoker   Pulmonary exam normal        Cardiovascular Exercise Tolerance: Good hypertension, (-) angina Normal cardiovascular exam     Neuro/Psych negative neurological ROS  negative psych ROS   GI/Hepatic negative GI ROS, Neg liver ROS,neg GERD  ,,  Endo/Other    Class 3 obesity  Renal/GU negative Renal ROS  negative genitourinary   Musculoskeletal   Abdominal   Peds  Hematology negative hematology ROS (+)   Anesthesia Other Findings Past Medical History: No date: Anxiety No date: Arthritis     Comment:  knees and left thumb No date: Calculus of gallbladder with other cholecystitis, without  mention of obstruction No date: Colon polyps No date: Complication of anesthesia     Comment:  Diff intubation - 05/01/11 - [facility] -  see report in paper               chart 05/01/2011: Difficult intubation     Comment:  during cholecystectomy No date: Hypercalcemia No date: Hyperlipidemia, mixed No date: Hyperparathyroidism (HCC) No date: Morbid obesity with BMI of 45.0-49.9, adult (HCC) No date: Pinched nerve     Comment:  lower back No date: Pre-diabetes No date: Primary hypertension 05/2023: Primary osteoarthritis of left knee No date: Seasonal allergies No date: Vitamin D deficiency  Past Surgical History: 08/26/1997: ABDOMINAL HYSTERECTOMY 06/10/2011: BREAST BIOPSY; Right 2023: BREAST BIOPSY; Right 10/04/2016: BREAST BIOPSY; Right 06/19/2023: CARPAL TUNNEL RELEASE; Left     Comment:   Procedure: CARPAL TUNNEL RELEASE ENDOSCOPIC;  Surgeon:               Christena Flake, MD;  Location: [facility];  Service:               Orthopedics;  Laterality: Left; 05/01/2011: CHOLECYSTECTOMY     Comment:  diff intubation - see records 06/10/2012: COLONOSCOPY     Comment:  Dr Mechele Collin 04/10/2009: COLONOSCOPY 06/22/2015: COLONOSCOPY 07/16/2018: COLONOSCOPY No date: DILATION AND CURETTAGE OF UTERUS 02/10/2015: KNEE ARTHROSCOPY; Left     Comment:  Procedure: ARTHROSCOPY KNEE;  Surgeon: Erin Sons,               MD;  Location: [facility];  Service:               Orthopedics;  Laterality: Left; 05/10/2021: PARATHYROIDECTOMY; Left     Comment:  Procedure: PARATHYROIDECTOMY;  Surgeon: Vernie Murders,               MD;  Location: [facility];  Service: ENT;                Laterality: Left;  leave first case 08/26/1969: TONSILLECTOMY AND ADENOIDECTOMY No date: TUMOR EXCISION; Right     Comment:  Hip - benign  BMI    Body Mass Index: 47.59 kg/m      Reproductive/Obstetrics negative OB ROS                             Anesthesia Physical Anesthesia Plan  ASA: 3  Anesthesia Plan: General   Post-op Pain Management:    Induction: Intravenous  PONV Risk Score and Plan: Propofol infusion and TIVA  Airway Management Planned: Natural Airway and Nasal Cannula  Additional Equipment:   Intra-op Plan:   Post-operative Plan:   Informed Consent: I have reviewed the patients History and Physical, chart, labs and discussed the procedure including the risks, benefits and alternatives for the proposed anesthesia with the patient or authorized representative who has indicated his/her understanding and acceptance.     Dental Advisory Given  Plan Discussed with: Anesthesiologist, CRNA and Surgeon  Anesthesia Plan Comments: (Patient consented for risks of anesthesia including but not limited to:  - adverse reactions to medications - risk of  airway placement if required - damage to eyes, teeth, lips or other oral mucosa - nerve damage due to positioning  - sore throat or hoarseness - Damage to heart, brain, nerves, lungs, other parts of body or loss of life  Patient voiced understanding and assent.)       Anesthesia Quick Evaluation

## 2023-11-19 DIAGNOSIS — Z1331 Encounter for screening for depression: Secondary | ICD-10-CM | POA: Diagnosis not present

## 2023-11-19 DIAGNOSIS — Z01419 Encounter for gynecological examination (general) (routine) without abnormal findings: Secondary | ICD-10-CM | POA: Diagnosis not present

## 2023-11-20 DIAGNOSIS — J301 Allergic rhinitis due to pollen: Secondary | ICD-10-CM | POA: Diagnosis not present

## 2023-11-21 DIAGNOSIS — M170 Bilateral primary osteoarthritis of knee: Secondary | ICD-10-CM | POA: Diagnosis not present

## 2023-12-02 DIAGNOSIS — J301 Allergic rhinitis due to pollen: Secondary | ICD-10-CM | POA: Diagnosis not present

## 2023-12-11 DIAGNOSIS — J301 Allergic rhinitis due to pollen: Secondary | ICD-10-CM | POA: Diagnosis not present

## 2023-12-25 DIAGNOSIS — J301 Allergic rhinitis due to pollen: Secondary | ICD-10-CM | POA: Diagnosis not present

## 2024-01-15 DIAGNOSIS — J301 Allergic rhinitis due to pollen: Secondary | ICD-10-CM | POA: Diagnosis not present

## 2024-02-05 DIAGNOSIS — J301 Allergic rhinitis due to pollen: Secondary | ICD-10-CM | POA: Diagnosis not present

## 2024-02-25 DIAGNOSIS — J301 Allergic rhinitis due to pollen: Secondary | ICD-10-CM | POA: Diagnosis not present

## 2024-03-04 DIAGNOSIS — J301 Allergic rhinitis due to pollen: Secondary | ICD-10-CM | POA: Diagnosis not present

## 2024-03-18 DIAGNOSIS — J301 Allergic rhinitis due to pollen: Secondary | ICD-10-CM | POA: Diagnosis not present

## 2024-03-19 DIAGNOSIS — E559 Vitamin D deficiency, unspecified: Secondary | ICD-10-CM | POA: Diagnosis not present

## 2024-03-19 DIAGNOSIS — I10 Essential (primary) hypertension: Secondary | ICD-10-CM | POA: Diagnosis not present

## 2024-03-19 DIAGNOSIS — E782 Mixed hyperlipidemia: Secondary | ICD-10-CM | POA: Diagnosis not present

## 2024-03-19 DIAGNOSIS — R7303 Prediabetes: Secondary | ICD-10-CM | POA: Diagnosis not present

## 2024-03-19 DIAGNOSIS — Z79899 Other long term (current) drug therapy: Secondary | ICD-10-CM | POA: Diagnosis not present

## 2024-03-26 DIAGNOSIS — I10 Essential (primary) hypertension: Secondary | ICD-10-CM | POA: Diagnosis not present

## 2024-03-26 DIAGNOSIS — Z1331 Encounter for screening for depression: Secondary | ICD-10-CM | POA: Diagnosis not present

## 2024-03-26 DIAGNOSIS — Z0000 Encounter for general adult medical examination without abnormal findings: Secondary | ICD-10-CM | POA: Diagnosis not present

## 2024-04-01 DIAGNOSIS — J301 Allergic rhinitis due to pollen: Secondary | ICD-10-CM | POA: Diagnosis not present

## 2024-04-02 ENCOUNTER — Other Ambulatory Visit: Payer: Self-pay | Admitting: Internal Medicine

## 2024-04-02 ENCOUNTER — Encounter (INDEPENDENT_AMBULATORY_CARE_PROVIDER_SITE_OTHER): Payer: Self-pay | Admitting: Vascular Surgery

## 2024-04-02 ENCOUNTER — Ambulatory Visit (INDEPENDENT_AMBULATORY_CARE_PROVIDER_SITE_OTHER): Payer: No Typology Code available for payment source | Admitting: Vascular Surgery

## 2024-04-02 VITALS — BP 134/81 | HR 92 | Ht 65.0 in | Wt 284.0 lb

## 2024-04-02 DIAGNOSIS — I998 Other disorder of circulatory system: Secondary | ICD-10-CM | POA: Diagnosis not present

## 2024-04-02 DIAGNOSIS — Z6841 Body Mass Index (BMI) 40.0 and over, adult: Secondary | ICD-10-CM

## 2024-04-02 DIAGNOSIS — R928 Other abnormal and inconclusive findings on diagnostic imaging of breast: Secondary | ICD-10-CM

## 2024-04-02 DIAGNOSIS — I10 Essential (primary) hypertension: Secondary | ICD-10-CM

## 2024-04-02 DIAGNOSIS — K219 Gastro-esophageal reflux disease without esophagitis: Secondary | ICD-10-CM

## 2024-04-02 DIAGNOSIS — E66813 Obesity, class 3: Secondary | ICD-10-CM | POA: Diagnosis not present

## 2024-04-02 NOTE — Progress Notes (Signed)
 Subjective:    Patient ID: Laurie Hubbard, female    DOB: 13-Mar-1966, 58 y.o.   MRN: [id number] Chief Complaint  Patient presents with   Advice Only    Laurie Hubbard is a 58 yo female who presents to clinic today with Chief complaint of Swelling to her feet and discoloration of her toes, left leg greater than her right.  Patient endorses this started about 3 months ago and has become more current.  The discoloration now stays most of the time.  Swelling is intermittent, if she lays down overnight the swelling to her feet or ankles can dissipate but as soon as she stands up swelling comes back the next morning.  Patient has a long history of arthritis in her ankles, knees and difficulty with her spine.  Therefore she sleeps in her recliner and cannot lay flat.  Patient presents today for evaluation of her lower extremities.    Review of Systems  Constitutional: Negative.   Cardiovascular:  Positive for leg swelling.  Musculoskeletal:  Positive for back pain and joint swelling.       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  Skin:  Positive for color change.       Color change to her feet and toes bilaterally.  Psychiatric/Behavioral:  Positive for sleep disturbance.        Prior history of sleep disturbance patient contributes to the use of gabapentin.  All other systems reviewed and are negative.      Objective:   Physical Exam Vitals reviewed.  Constitutional:      Appearance: Normal appearance. She is obese.  HENT:     Head: Normocephalic.  Eyes:     Pupils: Pupils are equal, round, and reactive to light.  Cardiovascular:     Rate and Rhythm: Normal rate and regular rhythm.     Pulses: Normal pulses.     Heart sounds: Normal heart sounds.  Pulmonary:     Effort: Pulmonary effort is normal.     Breath sounds: Normal breath sounds.  Abdominal:     General: Bowel sounds are normal.     Palpations: Abdomen is soft.  Musculoskeletal:      Right lower leg: Edema present.     Left lower leg: Edema present.  Skin:    General: Skin is warm and dry.     Capillary Refill: Capillary refill takes 2 to 3 seconds.     Comments: Purple discoloration bilaterally to the patient's toes and forefoot  Neurological:     General: No focal deficit present.     Mental Status: She is alert and oriented to person, place, and time. Mental status is at baseline.  Psychiatric:        Mood and Affect: Mood normal.        Behavior: Behavior normal.        Thought Content: Thought content normal.        Judgment: Judgment normal.     BP 134/81   Pulse 92   Ht 5' 5 (1.651 m)   Wt 284 lb (128.8 kg)   BMI 47.26 kg/m   Past Medical History:  Diagnosis Date   Anxiety    Arthritis    knees and left thumb   Calculus of gallbladder with other cholecystitis, without mention of obstruction    Colon polyps    Complication of anesthesia    Diff intubation - 05/01/11 - [facility] -  see report in paper chart  Difficult intubation 05/01/2011   during cholecystectomy   Hypercalcemia    Hyperlipidemia, mixed    Hyperparathyroidism (HCC)    Morbid obesity with BMI of 45.0-49.9, adult (HCC)    Pinched nerve    lower back   Pre-diabetes    Primary hypertension    Primary osteoarthritis of left knee 05/2023   Seasonal allergies    Vitamin D  deficiency     Social History   Socioeconomic History   Marital status: Married    Spouse name: Jimmy   Number of children: 2   Years of education: Not on file   Highest education level: Not on file  Occupational History   Not on file  Tobacco Use   Smoking status: Former    Current packs/day: 0.00    Types: Cigarettes    Quit date: 08/26/1984    Years since quitting: 39.6   Smokeless tobacco: Never  Vaping Use   Vaping status: Never Used  Substance and Sexual Activity   Alcohol use: Yes    Alcohol/week: 1.0 standard drink of alcohol    Types: 1 Glasses of wine per week   Drug use: No   Sexual  activity: Never  Other Topics Concern   Not on file  Social History Narrative   Not on file   Social Drivers of Health   Financial Resource Strain: Low Risk  (03/25/2024)   Received from [facility]   Overall Financial Resource Strain (CARDIA)    Difficulty of Paying Living Expenses: Not hard at all  Food Insecurity: No Food Insecurity (03/25/2024)   Received from [facility]   Hunger Vital Sign    Within the past 12 months, you worried that your food would run out before you got the money to buy more.: Never true    Within the past 12 months, the food you bought just didn't last and you didn't have money to get more.: Never true  Transportation Needs: No Transportation Needs (03/25/2024)   Received from [facility] - Transportation    In the past 12 months, has lack of transportation kept you from medical appointments or from getting medications?: No    Lack of Transportation (Non-Medical): No  Physical Activity: Not on file  Stress: Not on file  Social Connections: Not on file  Intimate Partner Violence: Not on file    Past Surgical History:  Procedure Laterality Date   ABDOMINAL HYSTERECTOMY  08/26/1997   BREAST BIOPSY Right 06/10/2011   BREAST BIOPSY Right 2023   BREAST BIOPSY Right 10/04/2016   CARPAL TUNNEL RELEASE Left 06/19/2023   Procedure: CARPAL TUNNEL RELEASE ENDOSCOPIC;  Surgeon: Edie Norleen PARAS, MD;  Location: [facility];  Service: Orthopedics;  Laterality: Left;   CHOLECYSTECTOMY  05/01/2011   diff intubation - see records   COLONOSCOPY  06/10/2012   Dr Viktoria   COLONOSCOPY  04/10/2009   COLONOSCOPY  06/22/2015   COLONOSCOPY  07/16/2018   COLONOSCOPY WITH PROPOFOL  N/A 11/11/2023   Procedure: COLONOSCOPY WITH PROPOFOL ;  Surgeon: Maryruth Ole DASEN, MD;  Location: [facility];  Service: Endoscopy;  Laterality: N/A;   DILATION AND CURETTAGE OF UTERUS     KNEE ARTHROSCOPY Left 02/10/2015    Procedure: ARTHROSCOPY KNEE;  Surgeon: Helayne Glenn, MD;  Location: [facility];  Service: Orthopedics;  Laterality: Left;   PARATHYROIDECTOMY Left 05/10/2021   Procedure: PARATHYROIDECTOMY;  Surgeon: Edda Mt, MD;  Location: [facility];  Service: ENT;  Laterality:  Left;  leave first case   TONSILLECTOMY AND ADENOIDECTOMY  08/26/1969   TUMOR EXCISION Right    Hip - benign    Family History  Problem Relation Age of Onset   Cancer Mother        ovarian, uterine   Diabetes Sister    Cancer Maternal Grandmother        ovarian, uterine    Allergies  Allergen Reactions   Cinnamon Other (See Comments)    By test   Garlic Other (See Comments)    By test   Benigno [Cephalexin] Itching    Lips swell       Latest Ref Rng & Units 06/17/2023    1:50 PM  CBC  WBC 4.0 - 10.5 K/uL 13.1   Hemoglobin 12.0 - 15.0 g/dL 85.2   Hematocrit 63.9 - 46.0 % 45.0   Platelets 150 - 400 K/uL 311       CMP     Component Value Date/Time   NA 137 06/17/2023 1350   NA 139 05/04/2015 1631   K 3.7 06/17/2023 1350   CL 100 06/17/2023 1350   CO2 27 06/17/2023 1350   GLUCOSE 120 (H) 06/17/2023 1350   BUN 23 (H) 06/17/2023 1350   BUN 13 05/04/2015 1631   CREATININE 0.83 06/17/2023 1350   CALCIUM 9.6 06/17/2023 1350   PROT 7.2 05/04/2015 1631   ALBUMIN 4.6 05/04/2015 1631   AST 23 05/04/2015 1631   ALT 25 05/04/2015 1631   ALKPHOS 77 05/04/2015 1631   BILITOT 0.7 05/04/2015 1631   GFRNONAA >60 06/17/2023 1350     No results found.     Assessment & Plan:   1. Vascular insufficiency of extremity (Primary) Recommend:  I have had a long discussion with the patient regarding swelling and why it  causes symptoms.  Patient will begin wearing graduated compression on a daily basis a prescription was given. The patient will  wear the stockings first thing in the morning and removing them in the evening. The patient is instructed specifically not to sleep in the stockings.    In addition, behavioral modification will be initiated.  This will include frequent elevation, use of over the counter pain medications and exercise such as walking.  Consideration for a lymph pump will also be made based upon the effectiveness of conservative therapy.  This would help to improve the edema control and prevent sequela such as ulcers and infections   Patient should undergo duplex ultrasound of the venous system to ensure that DVT or reflux is not present.  The patient will follow-up with me after the ultrasound.   2. Primary hypertension Continue antihypertensive medications as already ordered, these medications have been reviewed and there are no changes at this time.  3. Gastroesophageal reflux disease without esophagitis Continue PPI as already ordered, this medication has been reviewed and there are no changes at this time.  Avoidence of caffeine and alcohol  Moderate elevation of the head of the bed   4. Class 3 severe obesity without serious comorbidity with body mass index (BMI) of 45.0 to 49.9 in adult, unspecified obesity type I had a 10-minute conversation with the patient this morning regarding her weight and how this contributes to bilateral lower extremity swelling and venous pooling.  We discussed in detail her diet or exercise and her ability to lose weight over the past many years.  She finds this very difficult.  Patient was counseled on diet and exercise with her current  condition.   Current Outpatient Medications on File Prior to Visit  Medication Sig Dispense Refill   cetirizine (ZYRTEC) 10 MG tablet Take 10 mg by mouth daily. AM     Cholecalciferol (EQL VITAMIN D3) 50 MCG (2000 UT) CAPS Take 1 capsule by mouth daily.     EPINEPHrine  0.3 mg/0.3 mL IJ SOAJ injection Inject 0.3 mg into the muscle as needed for anaphylaxis.     ergocalciferol  (VITAMIN D2) 1.25 MG (50000 UT) capsule Take 50,000 Units by mouth once a week. Thurs     gabapentin (NEURONTIN)  300 MG capsule Take 300 mg by mouth at bedtime.     ibuprofen (ADVIL) 800 MG tablet Take 800 mg by mouth daily as needed.     lidocaine  (LIDODERM ) 5 % Place 1 patch onto the skin daily as needed. Remove & Discard patch within 12 hours or as directed by MD     lisinopril-hydrochlorothiazide (PRINZIDE,ZESTORETIC) 20-25 MG per tablet Take 1 tablet by mouth daily. AM     Multiple Vitamin (MULTIVITAMIN WITH MINERALS) TABS tablet Take 1 tablet by mouth daily.     omeprazole (PRILOSEC) 40 MG capsule Take 40 mg by mouth daily as needed.     PARoxetine (PAXIL) 10 MG tablet Take 1 tablet by mouth every other day. AM     Phenazopyrid-Cranbry-C-Probiot (AZO URINARY TRACT SUPPORT PO) Take 2 tablets by mouth daily.     phentermine 37.5 MG capsule Take 37.5 mg by mouth every morning.     propranolol (INDERAL) 20 MG tablet Take 20 mg by mouth 2 (two) times daily.     QUNOL COQ10/UBIQUINOL/MEGA PO Take 1 tablet by mouth daily.     simvastatin (ZOCOR) 10 MG tablet Take 1 tablet by mouth at bedtime.     No current facility-administered medications on file prior to visit.    There are no Patient Instructions on file for this visit. No follow-ups on file.   Gwendlyn JONELLE Shank, NP

## 2024-04-05 ENCOUNTER — Other Ambulatory Visit: Payer: Self-pay | Admitting: Internal Medicine

## 2024-04-05 DIAGNOSIS — H900 Conductive hearing loss, bilateral: Secondary | ICD-10-CM | POA: Diagnosis not present

## 2024-04-05 DIAGNOSIS — H6123 Impacted cerumen, bilateral: Secondary | ICD-10-CM | POA: Diagnosis not present

## 2024-04-05 DIAGNOSIS — N6489 Other specified disorders of breast: Secondary | ICD-10-CM

## 2024-04-05 DIAGNOSIS — Z1231 Encounter for screening mammogram for malignant neoplasm of breast: Secondary | ICD-10-CM

## 2024-04-06 ENCOUNTER — Encounter (INDEPENDENT_AMBULATORY_CARE_PROVIDER_SITE_OTHER): Payer: Self-pay

## 2024-04-06 NOTE — Telephone Encounter (Signed)
 Tried to call patient to move appointment up sooner. Left voicemail on patient's cell phone number to call [facility] back to move appointment up.

## 2024-04-06 NOTE — Telephone Encounter (Signed)
 We can move up the appointment with scheduled studies, with available time

## 2024-04-07 DIAGNOSIS — M170 Bilateral primary osteoarthritis of knee: Secondary | ICD-10-CM | POA: Diagnosis not present

## 2024-04-15 DIAGNOSIS — J301 Allergic rhinitis due to pollen: Secondary | ICD-10-CM | POA: Diagnosis not present

## 2024-04-29 ENCOUNTER — Ambulatory Visit
Admission: RE | Admit: 2024-04-29 | Discharge: 2024-04-29 | Disposition: A | Discharge status: Home / Self Care | Source: Ambulatory Visit | Attending: Internal Medicine | Admitting: Internal Medicine

## 2024-04-29 DIAGNOSIS — R928 Other abnormal and inconclusive findings on diagnostic imaging of breast: Secondary | ICD-10-CM

## 2024-04-29 MED ORDER — GADOPICLENOL 0.5 MMOL/ML IV SOLN
10.0000 mL | Freq: Once | INTRAVENOUS | Status: AC | PRN
  Administered 2024-04-29: 10 mL via INTRAVENOUS

## 2024-05-06 DIAGNOSIS — J301 Allergic rhinitis due to pollen: Secondary | ICD-10-CM | POA: Diagnosis not present

## 2024-05-17 ENCOUNTER — Other Ambulatory Visit (INDEPENDENT_AMBULATORY_CARE_PROVIDER_SITE_OTHER): Payer: Self-pay | Admitting: Vascular Surgery

## 2024-05-17 DIAGNOSIS — I998 Other disorder of circulatory system: Secondary | ICD-10-CM

## 2024-05-18 DIAGNOSIS — J301 Allergic rhinitis due to pollen: Secondary | ICD-10-CM | POA: Diagnosis not present

## 2024-05-19 ENCOUNTER — Encounter (INDEPENDENT_AMBULATORY_CARE_PROVIDER_SITE_OTHER): Payer: Self-pay | Admitting: Vascular Surgery

## 2024-05-19 ENCOUNTER — Ambulatory Visit (INDEPENDENT_AMBULATORY_CARE_PROVIDER_SITE_OTHER): Admitting: Vascular Surgery

## 2024-05-19 ENCOUNTER — Other Ambulatory Visit (INDEPENDENT_AMBULATORY_CARE_PROVIDER_SITE_OTHER)

## 2024-05-19 ENCOUNTER — Encounter (INDEPENDENT_AMBULATORY_CARE_PROVIDER_SITE_OTHER)

## 2024-05-19 VITALS — BP 124/77 | HR 96 | Ht 65.0 in | Wt 284.2 lb

## 2024-05-19 DIAGNOSIS — K219 Gastro-esophageal reflux disease without esophagitis: Secondary | ICD-10-CM

## 2024-05-19 DIAGNOSIS — E66813 Obesity, class 3: Secondary | ICD-10-CM

## 2024-05-19 DIAGNOSIS — I998 Other disorder of circulatory system: Secondary | ICD-10-CM

## 2024-05-19 DIAGNOSIS — Z6841 Body Mass Index (BMI) 40.0 and over, adult: Secondary | ICD-10-CM

## 2024-05-19 DIAGNOSIS — I10 Essential (primary) hypertension: Secondary | ICD-10-CM

## 2024-05-19 NOTE — Progress Notes (Signed)
 Subjective:    Patient ID: Laurie Hubbard, female    DOB: Jan 04, 1966, 58 y.o.   MRN: [id number] No chief complaint on file.   Laurie Hubbard is a 58 yo female who presents to clinic today with chief complaint of left lower extremity pain with discoloration i.e. purple toes.  Patient returns today for 51-month follow-up after starting conventional therapies such as compression, elevation, rest and exercise.  Patient endorses swelling has improved some after using compression.  She had previous varicose veins that have seemed to gotten better as well.  She denies claudication pain at rest or on ambulation.  However she does still have discoloration to her left foot mostly in her toes when she is sitting in a dependent position.  Patient underwent venous duplex ultrasounds of her bilateral lower extremities today.  She is noted to have significant reflux in both her right and left common femoral vein down through her popliteal into her calfs.  She has a history of ablation/vein stripping in her right common femoral vein but continues to have reflux proximal and distal to the previous ablation years ago.    Review of Systems  Constitutional: Negative.   Skin:  Positive for color change.       Purple discoloration to the toes of the left lower extremity.  All other systems reviewed and are negative.      Objective:   Physical Exam Vitals reviewed.  Constitutional:      Appearance: Normal appearance. She is obese.  HENT:     Head: Normocephalic.  Eyes:     Pupils: Pupils are equal, round, and reactive to light.  Cardiovascular:     Rate and Rhythm: Normal rate and regular rhythm.     Pulses: Normal pulses.     Heart sounds: Normal heart sounds.  Pulmonary:     Effort: Pulmonary effort is normal.     Breath sounds: Normal breath sounds.  Abdominal:     General: Bowel sounds are normal.     Palpations: Abdomen is soft.  Musculoskeletal:        General: Normal range of motion.   Skin:    General: Skin is warm and dry.     Capillary Refill: Capillary refill takes 2 to 3 seconds.     Comments: Discoloration of her left lower extremity most notably in her toes when she is dependent  Neurological:     General: No focal deficit present.     Mental Status: She is alert and oriented to person, place, and time. Mental status is at baseline.  Psychiatric:        Mood and Affect: Mood normal.        Behavior: Behavior normal.        Thought Content: Thought content normal.        Judgment: Judgment normal.     BP 124/77   Pulse 96   Ht 5' 5 (1.651 m)   Wt 284 lb 4 oz (128.9 kg)   BMI 47.30 kg/m   Past Medical History:  Diagnosis Date   Anxiety    Arthritis    knees and left thumb   Calculus of gallbladder with other cholecystitis, without mention of obstruction    Colon polyps    Complication of anesthesia    Diff intubation - 05/01/11 - [facility] -  see report in paper chart   Difficult intubation 05/01/2011   during cholecystectomy   Hypercalcemia    Hyperlipidemia, mixed  Hyperparathyroidism    Morbid obesity with BMI of 45.0-49.9, adult (HCC)    Pinched nerve    lower back   Pre-diabetes    Primary hypertension    Primary osteoarthritis of left knee 05/2023   Seasonal allergies    Vitamin D  deficiency     Social History   Socioeconomic History   Marital status: Married    Spouse name: Jimmy   Number of children: 2   Years of education: Not on file   Highest education level: Not on file  Occupational History   Not on file  Tobacco Use   Smoking status: Former    Current packs/day: 0.00    Types: Cigarettes    Quit date: 08/26/1984    Years since quitting: 39.7   Smokeless tobacco: Never  Vaping Use   Vaping status: Never Used  Substance and Sexual Activity   Alcohol use: Yes    Alcohol/week: 1.0 standard drink of alcohol    Types: 1 Glasses of wine per week   Drug use: No   Sexual activity: Never  Other Topics Concern   Not on  file  Social History Narrative   Not on file   Social Drivers of Health   Financial Resource Strain: Low Risk  (03/25/2024)   Received from [facility]   Overall Financial Resource Strain (CARDIA)    Difficulty of Paying Living Expenses: Not hard at all  Food Insecurity: No Food Insecurity (03/25/2024)   Received from [facility]   Hunger Vital Sign    Within the past 12 months, you worried that your food would run out before you got the money to buy more.: Never true    Within the past 12 months, the food you bought just didn't last and you didn't have money to get more.: Never true  Transportation Needs: No Transportation Needs (03/25/2024)   Received from [facility] - Transportation    In the past 12 months, has lack of transportation kept you from medical appointments or from getting medications?: No    Lack of Transportation (Non-Medical): No  Physical Activity: Not on file  Stress: Not on file  Social Connections: Not on file  Intimate Partner Violence: Not on file    Past Surgical History:  Procedure Laterality Date   ABDOMINAL HYSTERECTOMY  08/26/1997   BREAST BIOPSY Right 06/10/2011   BREAST BIOPSY Right 2023   BREAST BIOPSY Right 10/04/2016   CARPAL TUNNEL RELEASE Left 06/19/2023   Procedure: CARPAL TUNNEL RELEASE ENDOSCOPIC;  Surgeon: Edie Norleen PARAS, MD;  Location: [facility] ORS;  Service: Orthopedics;  Laterality: Left;   CHOLECYSTECTOMY  05/01/2011   diff intubation - see records   COLONOSCOPY  06/10/2012   Dr Viktoria   COLONOSCOPY  04/10/2009   COLONOSCOPY  06/22/2015   COLONOSCOPY  07/16/2018   COLONOSCOPY WITH PROPOFOL  N/A 11/11/2023   Procedure: COLONOSCOPY WITH PROPOFOL ;  Surgeon: Maryruth Ole DASEN, MD;  Location: [facility] ENDOSCOPY;  Service: Endoscopy;  Laterality: N/A;   DILATION AND CURETTAGE OF UTERUS     KNEE ARTHROSCOPY Left 02/10/2015   Procedure: ARTHROSCOPY KNEE;  Surgeon: Helayne Glenn,  MD;  Location: [facility];  Service: Orthopedics;  Laterality: Left;   PARATHYROIDECTOMY Left 05/10/2021   Procedure: PARATHYROIDECTOMY;  Surgeon: Edda Mt, MD;  Location: [facility];  Service: ENT;  Laterality: Left;  leave first case   TONSILLECTOMY AND ADENOIDECTOMY  08/26/1969   TUMOR EXCISION Right  Hip - benign    Family History  Problem Relation Age of Onset   Cancer Mother        ovarian, uterine   Diabetes Sister    Cancer Maternal Grandmother        ovarian, uterine    Allergies  Allergen Reactions   Cinnamon Other (See Comments)    By test   Garlic Other (See Comments)    By test   Keflet [Cephalexin] Itching    Lips swell       Latest Ref Rng & Units 06/17/2023    1:50 PM  CBC  WBC 4.0 - 10.5 K/uL 13.1   Hemoglobin 12.0 - 15.0 g/dL 85.2   Hematocrit 63.9 - 46.0 % 45.0   Platelets 150 - 400 K/uL 311       CMP     Component Value Date/Time   NA 137 06/17/2023 1350   NA 139 05/04/2015 1631   K 3.7 06/17/2023 1350   CL 100 06/17/2023 1350   CO2 27 06/17/2023 1350   GLUCOSE 120 (H) 06/17/2023 1350   BUN 23 (H) 06/17/2023 1350   BUN 13 05/04/2015 1631   CREATININE 0.83 06/17/2023 1350   CALCIUM 9.6 06/17/2023 1350   PROT 7.2 05/04/2015 1631   ALBUMIN 4.6 05/04/2015 1631   AST 23 05/04/2015 1631   ALT 25 05/04/2015 1631   ALKPHOS 77 05/04/2015 1631   BILITOT 0.7 05/04/2015 1631   GFRNONAA >60 06/17/2023 1350     No results found.     Assessment & Plan:   1. Vascular insufficiency of extremity (Primary) Recommend  I have reviewed my previous  discussion with the patient regarding  varicose veins and why they cause symptoms. Patient will continue  wearing graduated compression stockings class 1 on a daily basis, beginning first thing in the morning and removing them in the evening.  The patient is CEAP [id number].  The patient has been wearing compression for more than 12 weeks with no or little benefit.  The patient  has been exercising daily for more than 12 weeks. The patient has been elevating and taking OTC pain medications for more than 12 weeks.  None of these have have eliminated the pain related to the varicose veins and venous reflux or the discomfort regarding venous congestion.    In addition, behavioral modification including elevation during the day was again discussed and this will continue.  The patient has utilized over the counter pain medications and has been exercising.  However, at this time conservative therapy has not alleviated the patient's symptoms of leg pain and swelling  Recommend: laser ablation of the right and  left great saphenous veins to eliminate the symptoms of pain, swelling and discoloration of the lower extremities caused by the severe superficial venous reflux disease as indicated by the Venous ultrasounds completed today.   Patient would like to continue conventional therapy until after the New Year and follow up after for Laser Treatment   2. Primary hypertension Continue antihypertensive medications as already ordered, these medications have been reviewed and there are no changes at this time.   3. Gastroesophageal reflux disease without esophagitis Continue PPI as already ordered, this medication has been reviewed and there are no changes at this time.   Avoidence of caffeine and alcohol   Moderate elevation of the head of the bed   4. Class 3 severe obesity without serious comorbidity with body mass index (BMI) of 45.0 to 49.9 in adult, unspecified obesity  type I had a 10-minute conversation with the patient this morning regarding her weight and how this contributes to bilateral lower extremity swelling and venous pooling. We discussed in detail her diet or exercise and her ability to lose weight over the past many years. She finds this very difficult. Patient was counseled on diet and exercise with her current condition.    Current Outpatient Medications on  File Prior to Visit  Medication Sig Dispense Refill   cetirizine (ZYRTEC) 10 MG tablet Take 10 mg by mouth daily. AM     Cholecalciferol (EQL VITAMIN D3) 50 MCG (2000 UT) CAPS Take 1 capsule by mouth daily.     EPINEPHrine  0.3 mg/0.3 mL IJ SOAJ injection Inject 0.3 mg into the muscle as needed for anaphylaxis.     ergocalciferol  (VITAMIN D2) 1.25 MG (50000 UT) capsule Take 50,000 Units by mouth once a week. Thurs     gabapentin (NEURONTIN) 300 MG capsule Take 300 mg by mouth at bedtime.     ibuprofen (ADVIL) 800 MG tablet Take 800 mg by mouth daily as needed.     lidocaine  (LIDODERM ) 5 % Place 1 patch onto the skin daily as needed. Remove & Discard patch within 12 hours or as directed by MD     lisinopril-hydrochlorothiazide (PRINZIDE,ZESTORETIC) 20-25 MG per tablet Take 1 tablet by mouth daily. AM     Multiple Vitamin (MULTIVITAMIN WITH MINERALS) TABS tablet Take 1 tablet by mouth daily.     omeprazole (PRILOSEC) 40 MG capsule Take 40 mg by mouth daily as needed.     PARoxetine (PAXIL) 10 MG tablet Take 1 tablet by mouth every other day. AM     Phenazopyrid-Cranbry-C-Probiot (AZO URINARY TRACT SUPPORT PO) Take 2 tablets by mouth daily.     phentermine 37.5 MG capsule Take 37.5 mg by mouth every morning.     propranolol (INDERAL) 20 MG tablet Take 20 mg by mouth 2 (two) times daily.     QUNOL COQ10/UBIQUINOL/MEGA PO Take 1 tablet by mouth daily.     simvastatin (ZOCOR) 10 MG tablet Take 1 tablet by mouth at bedtime.     No current facility-administered medications on file prior to visit.    There are no Patient Instructions on file for this visit. No follow-ups on file.   Gwendlyn JONELLE Shank, NP

## 2024-05-27 DIAGNOSIS — J301 Allergic rhinitis due to pollen: Secondary | ICD-10-CM | POA: Diagnosis not present

## 2024-06-10 DIAGNOSIS — J301 Allergic rhinitis due to pollen: Secondary | ICD-10-CM | POA: Diagnosis not present

## 2024-06-17 DIAGNOSIS — J301 Allergic rhinitis due to pollen: Secondary | ICD-10-CM | POA: Diagnosis not present

## 2024-06-18 DIAGNOSIS — M170 Bilateral primary osteoarthritis of knee: Secondary | ICD-10-CM | POA: Diagnosis not present

## 2024-07-02 ENCOUNTER — Encounter (INDEPENDENT_AMBULATORY_CARE_PROVIDER_SITE_OTHER)

## 2024-07-02 ENCOUNTER — Ambulatory Visit (INDEPENDENT_AMBULATORY_CARE_PROVIDER_SITE_OTHER): Admitting: Vascular Surgery

## 2024-07-08 DIAGNOSIS — J301 Allergic rhinitis due to pollen: Secondary | ICD-10-CM | POA: Diagnosis not present

## 2024-07-12 DIAGNOSIS — M170 Bilateral primary osteoarthritis of knee: Secondary | ICD-10-CM | POA: Diagnosis not present

## 2024-07-29 DIAGNOSIS — J301 Allergic rhinitis due to pollen: Secondary | ICD-10-CM | POA: Diagnosis not present
